# Patient Record
Sex: Male | Born: 1970
Health system: Southern US, Community
[De-identification: ages and names within clinical notes are randomized; demographics above are authoritative.]

## PROBLEM LIST (undated history)

## (undated) DIAGNOSIS — F329 Major depressive disorder, single episode, unspecified: Secondary | ICD-10-CM

## (undated) DIAGNOSIS — F419 Anxiety disorder, unspecified: Secondary | ICD-10-CM

## (undated) DIAGNOSIS — J069 Acute upper respiratory infection, unspecified: Secondary | ICD-10-CM

## (undated) DIAGNOSIS — M1990 Unspecified osteoarthritis, unspecified site: Secondary | ICD-10-CM

## (undated) DIAGNOSIS — F32A Depression, unspecified: Secondary | ICD-10-CM

## (undated) HISTORY — DX: Depression, unspecified: F32.A

## (undated) HISTORY — DX: Major depressive disorder, single episode, unspecified: F32.9

## (undated) HISTORY — PX: JOINT REPLACEMENT: SHX530

---

## 1998-04-13 ENCOUNTER — Other Ambulatory Visit: Admission: RE | Admit: 1998-04-13 | Discharge: 1998-04-13 | Payer: Self-pay | Admitting: Family Medicine

## 2004-11-30 ENCOUNTER — Ambulatory Visit (HOSPITAL_COMMUNITY): Admission: RE | Admit: 2004-11-30 | Discharge: 2004-11-30 | Payer: Self-pay | Admitting: Family Medicine

## 2008-03-14 ENCOUNTER — Encounter: Admission: RE | Admit: 2008-03-14 | Discharge: 2008-03-14 | Payer: Self-pay | Admitting: Sports Medicine

## 2008-04-18 ENCOUNTER — Emergency Department: Payer: Self-pay | Admitting: Emergency Medicine

## 2011-09-26 ENCOUNTER — Encounter (HOSPITAL_COMMUNITY): Payer: Self-pay | Admitting: Pharmacy Technician

## 2011-10-01 ENCOUNTER — Encounter (HOSPITAL_COMMUNITY): Payer: Self-pay

## 2011-10-01 ENCOUNTER — Encounter (HOSPITAL_COMMUNITY)
Admission: RE | Admit: 2011-10-01 | Discharge: 2011-10-01 | Payer: BC Managed Care – PPO | Source: Ambulatory Visit | Attending: Orthopaedic Surgery | Admitting: Orthopaedic Surgery

## 2011-10-01 ENCOUNTER — Other Ambulatory Visit (HOSPITAL_COMMUNITY): Payer: Self-pay | Admitting: Orthopaedic Surgery

## 2011-10-01 HISTORY — DX: Acute upper respiratory infection, unspecified: J06.9

## 2011-10-01 HISTORY — DX: Unspecified osteoarthritis, unspecified site: M19.90

## 2011-10-01 LAB — PROTIME-INR
INR: 1.14 (ref 0.00–1.49)
Prothrombin Time: 14.8 seconds (ref 11.6–15.2)

## 2011-10-01 LAB — BASIC METABOLIC PANEL
Calcium: 9.7 mg/dL (ref 8.4–10.5)
GFR calc non Af Amer: 77 mL/min — ABNORMAL LOW (ref >90)
Sodium: 137 mEq/L (ref 135–145)

## 2011-10-01 LAB — URINALYSIS, ROUTINE W REFLEX MICROSCOPIC
Bilirubin Urine: NEGATIVE
Ketones, ur: NEGATIVE mg/dL
Nitrite: NEGATIVE
Protein, ur: NEGATIVE mg/dL
Urobilinogen, UA: 0.2 mg/dL (ref 0.0–1.0)

## 2011-10-01 LAB — CBC
MCH: 30.4 pg (ref 26.0–34.0)
MCHC: 33.9 g/dL (ref 30.0–36.0)
Platelets: 182 10*3/uL (ref 150–400)
RBC: 4.44 MIL/uL (ref 4.22–5.81)

## 2011-10-01 NOTE — Patient Instructions (Signed)
18 Roger Travis  10/01/2011   Your procedure is scheduled on:  Fri. 10/11/2011  Report to [facility] at 0530AM.  Call this number if you have problems the morning of surgery: [phone]   Remember:   Do not eat food:After Midnight.  Do not drink clear liquids: After Midnight.  Take these medicines the morning of surgery with A SIP OF WATER: NONE   Do not wear jewelry, make-up or nail polish.  Do not wear lotions, powders, or perfumes.  Do not shave 48 hours prior to surgery.  Do not bring valuables to the hospital.  Contacts, dentures or bridgework may not be worn into surgery.  Leave suitcase in the car. After surgery it may be brought to your room.  For patients admitted to the hospital, checkout time is 11:00 AM the day of discharge.   Patients discharged the day of surgery will not be allowed to drive home.  Name and phone number of your driver:   Special Instructions: CHG Shower Use Special Wash: 1/2 bottle night before surgery and 1/2 bottle morning of surgery.   Please read over the following fact sheets that you were given: MRSA Information

## 2011-10-11 ENCOUNTER — Inpatient Hospital Stay (HOSPITAL_COMMUNITY): Payer: BC Managed Care – PPO

## 2011-10-11 ENCOUNTER — Encounter (HOSPITAL_COMMUNITY): Payer: Self-pay | Admitting: Anesthesiology

## 2011-10-11 ENCOUNTER — Inpatient Hospital Stay (HOSPITAL_COMMUNITY): Payer: BC Managed Care – PPO | Admitting: Anesthesiology

## 2011-10-11 ENCOUNTER — Inpatient Hospital Stay (HOSPITAL_COMMUNITY)
Admission: RE | Admit: 2011-10-11 | Discharge: 2011-10-13 | DRG: 818 | Disposition: A | Discharge status: Home Health | Payer: BC Managed Care – PPO | Source: Ambulatory Visit | Attending: Orthopaedic Surgery | Admitting: Orthopaedic Surgery

## 2011-10-11 ENCOUNTER — Inpatient Hospital Stay: Admit: 2011-10-11 | Payer: Self-pay | Admitting: Orthopaedic Surgery

## 2011-10-11 ENCOUNTER — Encounter (HOSPITAL_COMMUNITY)
Admission: RE | Disposition: A | Discharge status: Home Health | Payer: Self-pay | Source: Ambulatory Visit | Attending: Orthopaedic Surgery

## 2011-10-11 ENCOUNTER — Encounter (HOSPITAL_COMMUNITY): Payer: Self-pay

## 2011-10-11 DIAGNOSIS — D1739 Benign lipomatous neoplasm of skin and subcutaneous tissue of other sites: Secondary | ICD-10-CM | POA: Diagnosis present

## 2011-10-11 DIAGNOSIS — M1610 Unilateral primary osteoarthritis, unspecified hip: Principal | ICD-10-CM

## 2011-10-11 DIAGNOSIS — M169 Osteoarthritis of hip, unspecified: Principal | ICD-10-CM | POA: Diagnosis present

## 2011-10-11 HISTORY — PX: LIPOMA EXCISION: SHX5283

## 2011-10-11 HISTORY — PX: TOTAL HIP ARTHROPLASTY: SHX124

## 2011-10-11 LAB — TYPE AND SCREEN: ABO/RH(D): A POS

## 2011-10-11 SURGERY — ARTHROPLASTY, HIP, TOTAL, ANTERIOR APPROACH
Anesthesia: General | Site: Arm Lower | Laterality: Right

## 2011-10-11 SURGERY — ARTHROPLASTY, HIP, TOTAL, ANTERIOR APPROACH
Anesthesia: General | Site: Hip | Laterality: Right | Wound class: Clean

## 2011-10-11 MED ORDER — METHOCARBAMOL 500 MG PO TABS
500.0000 mg | ORAL_TABLET | Freq: Four times a day (QID) | ORAL | Status: DC | PRN
  Administered 2011-10-12 – 2011-10-13 (×2): 500 mg via ORAL
  Filled 2011-10-11 (×2): qty 1

## 2011-10-11 MED ORDER — OXYCODONE HCL 5 MG PO TABS
5.0000 mg | ORAL_TABLET | ORAL | Status: DC | PRN
  Administered 2011-10-13: 5 mg via ORAL
  Filled 2011-10-11: qty 1

## 2011-10-11 MED ORDER — METOCLOPRAMIDE HCL 5 MG/ML IJ SOLN
5.0000 mg | Freq: Three times a day (TID) | INTRAMUSCULAR | Status: DC | PRN
  Administered 2011-10-11 (×2): 10 mg via INTRAVENOUS
  Filled 2011-10-11 (×2): qty 2

## 2011-10-11 MED ORDER — ONDANSETRON HCL 4 MG/2ML IJ SOLN
4.0000 mg | Freq: Four times a day (QID) | INTRAMUSCULAR | Status: DC | PRN
  Administered 2011-10-11: 4 mg via INTRAVENOUS
  Filled 2011-10-11: qty 2

## 2011-10-11 MED ORDER — METHOCARBAMOL 100 MG/ML IJ SOLN
500.0000 mg | Freq: Four times a day (QID) | INTRAVENOUS | Status: DC | PRN
  Administered 2011-10-11: 500 mg via INTRAVENOUS
  Filled 2011-10-11 (×2): qty 5

## 2011-10-11 MED ORDER — PROMETHAZINE HCL 25 MG/ML IJ SOLN: 6.2500 mg | INTRAMUSCULAR | Status: DC | PRN

## 2011-10-11 MED ORDER — THERA M PLUS PO TABS
1.0000 | ORAL_TABLET | Freq: Every day | ORAL | Status: DC
  Administered 2011-10-12 – 2011-10-13 (×2): 1 via ORAL
  Filled 2011-10-11 (×3): qty 1

## 2011-10-11 MED ORDER — BUPIVACAINE HCL 0.25 % IJ SOLN
INTRAMUSCULAR | Status: DC | PRN
  Administered 2011-10-11: 5 mL

## 2011-10-11 MED ORDER — ZOLPIDEM TARTRATE 5 MG PO TABS: 5.0000 mg | ORAL_TABLET | Freq: Every evening | ORAL | Status: DC | PRN

## 2011-10-11 MED ORDER — DIPHENHYDRAMINE HCL 12.5 MG/5ML PO ELIX
12.5000 mg | ORAL_SOLUTION | Freq: Four times a day (QID) | ORAL | Status: DC | PRN
  Filled 2011-10-11: qty 5

## 2011-10-11 MED ORDER — LACTATED RINGERS IV SOLN
INTRAVENOUS | Status: DC | PRN
  Administered 2011-10-11 (×3): via INTRAVENOUS

## 2011-10-11 MED ORDER — PHENOL 1.4 % MT LIQD: 1.0000 | OROMUCOSAL | Status: DC | PRN

## 2011-10-11 MED ORDER — DIPHENHYDRAMINE HCL 50 MG/ML IJ SOLN: 12.5000 mg | Freq: Four times a day (QID) | INTRAMUSCULAR | Status: DC | PRN

## 2011-10-11 MED ORDER — LIDOCAINE HCL (CARDIAC) 20 MG/ML IV SOLN
INTRAVENOUS | Status: DC | PRN
  Administered 2011-10-11: 100 mg via INTRAVENOUS

## 2011-10-11 MED ORDER — ONDANSETRON HCL 4 MG PO TABS: 4.0000 mg | ORAL_TABLET | Freq: Four times a day (QID) | ORAL | Status: DC | PRN

## 2011-10-11 MED ORDER — SODIUM CHLORIDE 0.9 % IV SOLN
INTRAVENOUS | Status: DC
  Administered 2011-10-11 – 2011-10-12 (×3): via INTRAVENOUS

## 2011-10-11 MED ORDER — ACETAMINOPHEN 10 MG/ML IV SOLN
INTRAVENOUS | Status: DC | PRN
  Administered 2011-10-11: 1000 mg via INTRAVENOUS

## 2011-10-11 MED ORDER — ONDANSETRON HCL 4 MG/2ML IJ SOLN
4.0000 mg | Freq: Four times a day (QID) | INTRAMUSCULAR | Status: DC | PRN
  Administered 2011-10-11: 4 mg via INTRAVENOUS

## 2011-10-11 MED ORDER — HYDROMORPHONE HCL PF 1 MG/ML IJ SOLN
0.2500 mg | INTRAMUSCULAR | Status: DC | PRN
  Administered 2011-10-11 (×2): 0.25 mg via INTRAVENOUS

## 2011-10-11 MED ORDER — SODIUM CHLORIDE 0.9 % IJ SOLN: 9.0000 mL | INTRAMUSCULAR | Status: DC | PRN

## 2011-10-11 MED ORDER — SODIUM CHLORIDE 0.9 % IV SOLN: INTRAVENOUS | Status: DC

## 2011-10-11 MED ORDER — MORPHINE SULFATE (PF) 1 MG/ML IV SOLN
INTRAVENOUS | Status: DC
  Administered 2011-10-11: 11:00:00 via INTRAVENOUS
  Administered 2011-10-11: 1.5 mg via INTRAVENOUS
  Filled 2011-10-11: qty 25

## 2011-10-11 MED ORDER — RIVAROXABAN 10 MG PO TABS
10.0000 mg | ORAL_TABLET | ORAL | Status: DC
  Administered 2011-10-12 – 2011-10-13 (×2): 10 mg via ORAL
  Filled 2011-10-11 (×2): qty 1

## 2011-10-11 MED ORDER — POLYETHYLENE GLYCOL 3350 17 G PO PACK
17.0000 g | PACK | Freq: Every day | ORAL | Status: DC | PRN
  Filled 2011-10-11: qty 1

## 2011-10-11 MED ORDER — HYDROMORPHONE 0.3 MG/ML IV SOLN
INTRAVENOUS | Status: DC
  Administered 2011-10-11: 0.4 mg via INTRAVENOUS
  Administered 2011-10-11: 7.5 mg via INTRAVENOUS
  Administered 2011-10-12: 0.999 mg via INTRAVENOUS
  Administered 2011-10-12: 1.59 mg via INTRAVENOUS
  Administered 2011-10-12: 2 mg via INTRAVENOUS
  Administered 2011-10-12: 1.39 mg via INTRAVENOUS
  Administered 2011-10-12: 0.4 mg via INTRAVENOUS
  Administered 2011-10-13: 05:00:00 via INTRAVENOUS
  Administered 2011-10-13: 0.799 mg via INTRAVENOUS
  Administered 2011-10-13: 0.6 mg via INTRAVENOUS
  Filled 2011-10-11 (×17): qty 25

## 2011-10-11 MED ORDER — MENTHOL 3 MG MT LOZG: 1.0000 | LOZENGE | OROMUCOSAL | Status: DC | PRN

## 2011-10-11 MED ORDER — ROCURONIUM BROMIDE 100 MG/10ML IV SOLN
INTRAVENOUS | Status: DC | PRN
  Administered 2011-10-11 (×2): 10 mg via INTRAVENOUS
  Administered 2011-10-11: 50 mg via INTRAVENOUS

## 2011-10-11 MED ORDER — GLYCOPYRROLATE 0.2 MG/ML IJ SOLN
INTRAMUSCULAR | Status: DC | PRN
  Administered 2011-10-11: .6 mg via INTRAVENOUS

## 2011-10-11 MED ORDER — PROPOFOL 10 MG/ML IV EMUL
INTRAVENOUS | Status: DC | PRN
  Administered 2011-10-11: 180 mg via INTRAVENOUS

## 2011-10-11 MED ORDER — ONDANSETRON HCL 4 MG/2ML IJ SOLN
INTRAMUSCULAR | Status: DC | PRN
  Administered 2011-10-11: 4 mg via INTRAVENOUS

## 2011-10-11 MED ORDER — SAM-E 400 MG PO TABS: 1.0000 | ORAL_TABLET | Freq: Every day | ORAL | Status: DC

## 2011-10-11 MED ORDER — FERROUS SULFATE 325 (65 FE) MG PO TABS
325.0000 mg | ORAL_TABLET | Freq: Three times a day (TID) | ORAL | Status: DC
  Administered 2011-10-12 – 2011-10-13 (×5): 325 mg via ORAL
  Filled 2011-10-11 (×8): qty 1

## 2011-10-11 MED ORDER — ACETAMINOPHEN 650 MG RE SUPP: 650.0000 mg | Freq: Four times a day (QID) | RECTAL | Status: DC | PRN

## 2011-10-11 MED ORDER — CEFAZOLIN SODIUM 1-5 GM-% IV SOLN
1.0000 g | Freq: Four times a day (QID) | INTRAVENOUS | Status: AC
  Administered 2011-10-11 – 2011-10-12 (×3): 1 g via INTRAVENOUS
  Filled 2011-10-11 (×3): qty 50

## 2011-10-11 MED ORDER — DOCUSATE SODIUM 100 MG PO CAPS
100.0000 mg | ORAL_CAPSULE | Freq: Two times a day (BID) | ORAL | Status: DC
  Administered 2011-10-11 – 2011-10-13 (×4): 100 mg via ORAL
  Filled 2011-10-11 (×6): qty 1

## 2011-10-11 MED ORDER — ACETAMINOPHEN 325 MG PO TABS
650.0000 mg | ORAL_TABLET | Freq: Four times a day (QID) | ORAL | Status: DC | PRN
  Administered 2011-10-13: 650 mg via ORAL
  Administered 2011-10-13: 325 mg via ORAL
  Filled 2011-10-11 (×2): qty 2

## 2011-10-11 MED ORDER — MORPHINE SULFATE 2 MG/ML IJ SOLN: 1.0000 mg | INTRAMUSCULAR | Status: DC | PRN

## 2011-10-11 MED ORDER — NALOXONE HCL 0.4 MG/ML IJ SOLN: 0.4000 mg | INTRAMUSCULAR | Status: DC | PRN

## 2011-10-11 MED ORDER — CEFAZOLIN SODIUM 1-5 GM-% IV SOLN
1.0000 g | Freq: Once | INTRAVENOUS | Status: AC
  Administered 2011-10-11: 1 g via INTRAVENOUS

## 2011-10-11 MED ORDER — HYDROMORPHONE HCL PF 1 MG/ML IJ SOLN
INTRAMUSCULAR | Status: DC | PRN
  Administered 2011-10-11: .5 mg via INTRAVENOUS
  Administered 2011-10-11: 1 mg via INTRAVENOUS
  Administered 2011-10-11: 0.5 mg via INTRAVENOUS

## 2011-10-11 MED ORDER — FLEET ENEMA 7-19 GM/118ML RE ENEM: 1.0000 | ENEMA | Freq: Every day | RECTAL | Status: DC | PRN

## 2011-10-11 MED ORDER — DIPHENHYDRAMINE HCL 12.5 MG/5ML PO ELIX: 12.5000 mg | ORAL_SOLUTION | Freq: Four times a day (QID) | ORAL | Status: DC | PRN

## 2011-10-11 MED ORDER — MAGNESIUM HYDROXIDE 400 MG/5ML PO SUSP: 30.0000 mL | Freq: Two times a day (BID) | ORAL | Status: DC | PRN

## 2011-10-11 MED ORDER — DIPHENHYDRAMINE HCL 12.5 MG/5ML PO ELIX: 12.5000 mg | ORAL_SOLUTION | ORAL | Status: DC | PRN

## 2011-10-11 MED ORDER — ONDANSETRON HCL 4 MG/2ML IJ SOLN
4.0000 mg | Freq: Four times a day (QID) | INTRAMUSCULAR | Status: DC | PRN
  Filled 2011-10-11: qty 2

## 2011-10-11 MED ORDER — FENTANYL CITRATE 0.05 MG/ML IJ SOLN
INTRAMUSCULAR | Status: DC | PRN
  Administered 2011-10-11: 50 ug via INTRAVENOUS
  Administered 2011-10-11: 100 ug via INTRAVENOUS
  Administered 2011-10-11 (×2): 50 ug via INTRAVENOUS

## 2011-10-11 MED ORDER — NEOSTIGMINE METHYLSULFATE 1 MG/ML IJ SOLN
INTRAMUSCULAR | Status: DC | PRN
  Administered 2011-10-11: 3 mg via INTRAVENOUS

## 2011-10-11 MED ORDER — METOCLOPRAMIDE HCL 10 MG PO TABS: 5.0000 mg | ORAL_TABLET | Freq: Three times a day (TID) | ORAL | Status: DC | PRN

## 2011-10-11 MED ORDER — BISACODYL 10 MG RE SUPP
10.0000 mg | Freq: Every day | RECTAL | Status: DC | PRN
Start: 2011-10-11 — End: 2011-10-13

## 2011-10-11 MED ORDER — BISACODYL 5 MG PO TBEC: 10.0000 mg | DELAYED_RELEASE_TABLET | Freq: Every day | ORAL | Status: DC | PRN

## 2011-10-11 MED ORDER — MIDAZOLAM HCL 5 MG/5ML IJ SOLN
INTRAMUSCULAR | Status: DC | PRN
  Administered 2011-10-11: 2 mg via INTRAVENOUS

## 2011-10-11 SURGICAL SUPPLY — 65 items
BAG SPEC THK2 15X12 ZIP CLS (MISCELLANEOUS) ×4
BAG ZIPLOCK 12X15 (MISCELLANEOUS) ×6 IMPLANT
BANDAGE ELASTIC 6 VELCRO ST LF (GAUZE/BANDAGES/DRESSINGS) ×3 IMPLANT
BANDAGE ESMARK 6X9 LF (GAUZE/BANDAGES/DRESSINGS) ×2 IMPLANT
BLADE SAW SGTL 18X1.27X75 (BLADE) ×3 IMPLANT
BNDG CMPR 9X6 STRL LF SNTH (GAUZE/BANDAGES/DRESSINGS) ×2
BNDG ESMARK 6X9 LF (GAUZE/BANDAGES/DRESSINGS) ×3
CELLS DAT CNTRL 66122 CELL SVR (MISCELLANEOUS) ×2 IMPLANT
CLOTH BEACON ORANGE TIMEOUT ST (SAFETY) ×3 IMPLANT
COVER SURGICAL LIGHT HANDLE (MISCELLANEOUS) ×3 IMPLANT
CUFF TOURN SGL QUICK 34 (TOURNIQUET CUFF) ×3
CUFF TRNQT CYL 34X4X40X1 (TOURNIQUET CUFF) ×2 IMPLANT
DRAPE C-ARM 42X72 X-RAY (DRAPES) ×3 IMPLANT
DRAPE ORTHO SPLIT 77X108 STRL (DRAPES) ×3
DRAPE POUCH INSTRU U-SHP 10X18 (DRAPES) ×3 IMPLANT
DRAPE STERI IOBAN 125X83 (DRAPES) ×3 IMPLANT
DRAPE SURG ORHT 6 SPLT 77X108 (DRAPES) ×2 IMPLANT
DRAPE U-SHAPE 47X51 STRL (DRAPES) ×9 IMPLANT
DRSG ADAPTIC 3X8 NADH LF (GAUZE/BANDAGES/DRESSINGS) ×3 IMPLANT
DRSG MEPILEX BORDER 4X8 (GAUZE/BANDAGES/DRESSINGS) ×3 IMPLANT
DURAPREP 26ML APPLICATOR (WOUND CARE) ×3 IMPLANT
ELECT BLADE TIP CTD 4 INCH (ELECTRODE) ×3 IMPLANT
ELECT REM PT RETURN 9FT ADLT (ELECTROSURGICAL) ×3
ELECTRODE REM PT RTRN 9FT ADLT (ELECTROSURGICAL) ×2 IMPLANT
EVACUATOR 1/8 PVC DRAIN (DRAIN) IMPLANT
FACESHIELD LNG OPTICON STERILE (SAFETY) ×12 IMPLANT
GAUZE KERLIX 2  STERILE LF (GAUZE/BANDAGES/DRESSINGS) ×1 IMPLANT
GAUZE XEROFORM 1X8 LF (GAUZE/BANDAGES/DRESSINGS) ×3 IMPLANT
GAUZE XEROFORM 4X4 STRL (GAUZE/BANDAGES/DRESSINGS) ×3 IMPLANT
GLOVE BIO SURGEON STRL SZ7 (GLOVE) ×3 IMPLANT
GLOVE BIO SURGEON STRL SZ7.5 (GLOVE) ×3 IMPLANT
GLOVE BIOGEL PI IND STRL 7.5 (GLOVE) IMPLANT
GLOVE BIOGEL PI IND STRL 8 (GLOVE) ×2 IMPLANT
GLOVE BIOGEL PI INDICATOR 7.5 (GLOVE)
GLOVE BIOGEL PI INDICATOR 8 (GLOVE) ×1
GLOVE ORTHO TXT STRL SZ7.5 (GLOVE) ×3 IMPLANT
GOWN PREVENTION PLUS LG XLONG (DISPOSABLE) ×6 IMPLANT
GOWN STRL REIN XL XLG (GOWN DISPOSABLE) ×3 IMPLANT
KIT BASIN OR (CUSTOM PROCEDURE TRAY) ×3 IMPLANT
MANIFOLD NEPTUNE II (INSTRUMENTS) ×3 IMPLANT
NS IRRIG 1000ML POUR BTL (IV SOLUTION) ×3 IMPLANT
PACK LOWER EXTREMITY WL (CUSTOM PROCEDURE TRAY) ×3 IMPLANT
PACK TOTAL JOINT (CUSTOM PROCEDURE TRAY) ×3 IMPLANT
PADDING CAST COTTON 6X4 STRL (CAST SUPPLIES) ×3 IMPLANT
POSITIONER SURGICAL ARM (MISCELLANEOUS) ×3 IMPLANT
RETRACTOR WND ALEXIS 18 MED (MISCELLANEOUS) ×1 IMPLANT
RTRCTR WOUND ALEXIS 18CM MED (MISCELLANEOUS) ×3
SPONGE GAUZE 4X4 12PLY (GAUZE/BANDAGES/DRESSINGS) ×1 IMPLANT
STAPLER SKIN PROX WIDE 3.9 (STAPLE) IMPLANT
STOCKINETTE 8 INCH (MISCELLANEOUS) ×2 IMPLANT
SUCTION FRAZIER 12FR DISP (SUCTIONS) ×3 IMPLANT
SUT ETHIBOND NAB CT1 #1 30IN (SUTURE) ×6 IMPLANT
SUT ETHILON 3 0 PS 1 (SUTURE) ×1 IMPLANT
SUT ETHILON 4 0 PS 2 18 (SUTURE) ×6 IMPLANT
SUT MNCRL AB 4-0 PS2 18 (SUTURE) ×3 IMPLANT
SUT PROLENE 4 0 RB 1 (SUTURE) ×3
SUT PROLENE 4-0 RB1 .5 CRCL 36 (SUTURE) ×2 IMPLANT
SUT VIC AB 1 CT1 36 (SUTURE) ×6 IMPLANT
SUT VIC AB 2-0 CT1 27 (SUTURE) ×6
SUT VIC AB 2-0 CT1 TAPERPNT 27 (SUTURE) ×4 IMPLANT
SYR BULB IRRIGATION 50ML (SYRINGE) ×2 IMPLANT
TOWEL BLUE STERILE X RAY DET (MISCELLANEOUS) ×9 IMPLANT
TOWEL OR 17X26 10 PK STRL BLUE (TOWEL DISPOSABLE) ×6 IMPLANT
TRAY FOLEY CATH 14FRSI W/METER (CATHETERS) ×3 IMPLANT
WATER STERILE IRR 1500ML POUR (IV SOLUTION) ×3 IMPLANT

## 2011-10-11 NOTE — Plan of Care (Signed)
Problem: Consults Goal: Diagnosis- Total Joint Replacement Outcome: Completed/Met Date Met:  10/11/11 Right anterior hip

## 2011-10-11 NOTE — Transfer of Care (Signed)
Immediate Anesthesia Transfer of Care Note  Patient: Roger Travis  Procedure(s) Performed:  TOTAL HIP ARTHROPLASTY ANTERIOR APPROACH; EXCISION LIPOMA  Patient Location: PACU  Anesthesia Type: General  Level of Consciousness: awake and alert   Airway & Oxygen Therapy: Patient Spontanous Breathing and Patient connected to face mask oxygen  Post-op Assessment: Report given to PACU RN and Post -op Vital signs reviewed and stable  Post vital signs: Reviewed and stable  Complications: No apparent anesthesia complications

## 2011-10-11 NOTE — Op Note (Signed)
NAMEROWDY, GUERRINI        ACCOUNT NO.:  [id number]  MEDICAL RECORD NO.:  [id number]  LOCATION:  1616                         FACILITY:  [facility]  PHYSICIAN:  Timothey Dahlstrom. Magnus Ivan, M.D.DATE OF BIRTH:  [birth year]  DATE OF PROCEDURE:  10/11/2011 DATE OF DISCHARGE:                              OPERATIVE REPORT   PREOPERATIVE DIAGNOSES: 1. Severe osteoarthritis and degenerative joint disease, right hip. 2. Right elbow superficial mass consistent with benign lipoma.  POSTOPERATIVE DIAGNOSES: 1. Severe osteoarthritis and degenerative joint disease, right hip. 2. Benign lipoma, right elbow.  PROCEDURES: 1. Right total hip arthroplasty through direct anterior approach. 2. Excision of superficial lipoma, right elbow.  IMPLANTS:  DePuy Pinnacle Sector acetabular component with 1 screw size 50, neutral +4 polyethylene liner, size 9 standard offset [place]ail femoral component with HA coating and a collar, size 32+ 1 ceramic hip ball.  SURGEON:  Kirill Chatterjee. Magnus Ivan, M.D.  ANESTHESIA:  General.  ANTIBIOTICS:  1 g IV Ancef.  BLOOD LOSS:  700 cc.  COMPLICATIONS:  None.  DISPOSITION:  To PACU in stable condition.  INDICATIONS:  Mr. Fahl is a very pleasant 40 year old gentleman with severe end-stage arthritis involving his right hip.  He has been a daily runner for a long period of time.  He has had documented x-ray evidence of bone on bone wear of his hip.  An x-ray several years ago in 2008 had a right hip arthroscopy.  Between 2008 and 2009, they found significant changes of with a torn labrum and it was recommended at that time that he would eventually need a hip replacement.  Now, he has failed the arthroscopic intervention as well as injections in the hip with steroid. It is at that point where he wishes to proceed with a total hip arthroplasty given the detriment to his quality of life, and his pain. The risks and benefits of surgery were explained to him in  detail, including the risk of acute blood loss anemia, DVT, and PE.  He also wants [place] to excise the lipoma on his right elbow, it has been there for over 10 years and has not grown in size, but it bothers him when he keeps his elbow down on things. On exam this looks like a benign lipoma.  PROCEDURE:  After informed consent was obtained, the appropriate right hip and right elbow were marked.  He was brought to the operating room and general anesthesia was obtained when he was on the stretcher.  A Foley catheter was then placed and traction boots were applied.  He was placed on the Hana fracture table with both arms out on arm boards.  A perineal post was placed and both feet were placed in the traction devices with no traction applied.  His right hip was then assessed under fluoroscopic guidance.  We could then obtain the hip center as well as the center of the pelvis, consistent with x-rays.  We then prepped the right hip with DuraPrep and sterile drapes including a sterile shower curtain drape.  Time-out was called to identify the correct patient, correct right hip and right elbow.  The surgeon made a decision to get the hip fixed first before proceeding with the elbow.  I then  made an incision 1 cm distal and 3 cm posterior to the anterior superior iliac spine and carried this obliquely down the leg.  We dissected easily to the tensor fascia lata.  It was felt that he had a  huge and strong tensor fascia lata muscle as well as vastus lateralis and quadriceps. We divided the tensor fascia lata and proceeded with a direct anterior approach to the hip.  Retractors were placed under the lateral neck and medial, we cauterized the lateral femoral circumflex vessels.  We did get a significant amount of bleeding from these vessels when we were trying to cauterize them.  I then divided the hip capsule and made a femoral neck cut proximal to the lesser trochanter, removed the head in its  entirety and found it to have complete loss of cartilage, especially around the superior and lateral aspects with the track of several centimeters of cartilage loss down the bone.  Medial retractor and lateral retractors were then placed and we began reaming from size 42 up to 48 reamer, the 48 and 49 reamers were placed under direct fluoroscopic guidance as well as visualization, so we could obtain inversion, depth and inclinations.  We then placed the real size 50 acetabular component from DePuy, which is a Pinnacle component with Gription.  We placed this and placed 1 screw.  We then placed a neutral +4 polyethylene liner and attention was turned to the femur.  With the leg externally rotated to 90 degrees, a hook placed temporarily underneath the vastus ridge, the leg was extended and abducted to allow access to the femoral canal.  We used a box cutting guide as well as a rongeur and then began broaching from size 8 just to a size 9 broach. He had very strong bone and very thick cortices at 40 years old, so after the size 9 trial we trialed a +1, 32 head and reduced this in the acetabulum.  His leg lengths showed him to be maybe just a touch short, but with other size he is too long, so I felt stability wise this would be the size to go with.  We then placed a real size 9 femoral component with HA coating and a collar and then the real +1 ceramic head, reduced this into the acetabulum and with our x-rays it looked like he was just even a touch long.  We could not go down a hip size and I could not go up a stem size as well.  He had stable external rotation past 90 degrees, and I could not dislocate it with internal rotation to 45 degrees.  His flexion, extension was good and he had minimal shuck.  We then copiously irrigated the tissues and closed the joint capsule with interrupted #1 Ethibond suture followed by running #1 in the tensor fascia lata.  The subcutaneous tissue was closed  with interrupted 2-0 nylon and interrupted staples on the skin.  A well-padded sterile dressing was applied.  We then broke scrub and re-prepped and draped the right elbow with DuraPrep and sterile drapes and a sterile stockinette. With new gown and gloves, I made a small incision over the benign appearing mass.  We dissected around this and it was consistent with a lipoma.  We removed this in its entirety and it was a little bigger than the size of a quarter.  We then copiously irrigated the tissue here and closed this with an interrupted 3-0 nylon suture.  A well-padded sterile dressing was  applied.  The patient was then awakened, extubated, taken off the fracture table, and to the recovery room in stable condition.     Vanita Panda. Magnus Ivan, M.D.     CYB/MEDQ  D:  10/11/2011  T:  10/11/2011  Job:  [id number]

## 2011-10-11 NOTE — Anesthesia Preprocedure Evaluation (Signed)
Anesthesia Evaluation  Patient identified by MRN, date of birth, ID band Patient awake    Reviewed: Allergy & Precautions, H&P , NPO status , Patient's Chart, lab work & pertinent test results, reviewed documented beta blocker date and time   Airway Mallampati: II  Neck ROM: Full    Dental  (+) Dental Advisory Given   Pulmonary neg pulmonary ROS,  clear to auscultation        Cardiovascular neg cardio ROS Regular Normal Denies cardiac symptoms   Neuro/Psych Negative Neurological ROS  Negative Psych ROS   GI/Hepatic negative GI ROS, Neg liver ROS,   Endo/Other  Negative Endocrine ROS  Renal/GU negative Renal ROS  Genitourinary negative   Musculoskeletal negative musculoskeletal ROS (+)   Abdominal   Peds negative pediatric ROS (+)  Hematology negative hematology ROS (+)   Anesthesia Other Findings Upper front bridge   Reproductive/Obstetrics negative OB ROS                           Anesthesia Physical Anesthesia Plan  ASA: I  Anesthesia Plan: General   Post-op Pain Management:    Induction: Intravenous  Airway Management Planned: Oral ETT  Additional Equipment:   Intra-op Plan:   Post-operative Plan: Extubation in OR  Informed Consent: I have reviewed the patients History and Physical, chart, labs and discussed the procedure including the risks, benefits and alternatives for the proposed anesthesia with the patient or authorized representative who has indicated his/her understanding and acceptance.     Plan Discussed with: CRNA and Surgeon  Anesthesia Plan Comments:         Anesthesia Quick Evaluation

## 2011-10-11 NOTE — Brief Op Note (Signed)
10/11/2011  10:14 AM  PATIENT:  Roger Travis  40 y.o. male  PRE-OPERATIVE DIAGNOSIS:  Severe Osteoarthritis of the Right Hip, Benign Appearing Mass Right Elbow  POST-OPERATIVE DIAGNOSIS:  Severe Osteoarthritis of the Right Hip, Benign Appearing Mass Right Elbow  PROCEDURE:  Procedure(s): TOTAL HIP ARTHROPLASTY ANTERIOR APPROACH EXCISION LIPOMA  SURGEON:  Surgeon(s): Kathryne Hitch  PHYSICIAN ASSISTANT:   ASSISTANTS: none   ANESTHESIA:   general  EBL:  Total I/O In: 2000 [I.V.:2000] Out: 900 [Urine:200; Blood:700]  BLOOD ADMINISTERED:none  DRAINS: none   LOCAL MEDICATIONS USED:  MARCAINE 5CC  SPECIMEN:  No Specimen  DISPOSITION OF SPECIMEN:  N/A  COUNTS:  YES  TOURNIQUET:  * Missing tourniquet times found for documented tourniquets in log:  7588 *  DICTATION: .Other Dictation: Dictation Number [id number]  PLAN OF CARE: Admit to inpatient   PATIENT DISPOSITION:  PACU - hemodynamically stable.   Delay start of Pharmacological VTE agent (>24hrs) due to surgical blood loss or risk of bleeding:  {YES/NO/NOT APPLICABLE:20182

## 2011-10-11 NOTE — Anesthesia Postprocedure Evaluation (Signed)
  Anesthesia Post-op Note  Patient: Arboriculturist  Procedure(s) Performed:  TOTAL HIP ARTHROPLASTY ANTERIOR APPROACH; EXCISION LIPOMA  Patient Location: PACU  Anesthesia Type: General  Level of Consciousness: awake and oriented  Airway and Oxygen Therapy: Patient Spontanous Breathing and Patient connected to nasal cannula oxygen  Post-op Pain: mild  Post-op Assessment: Post-op Vital signs reviewed, Patient's Cardiovascular Status Stable, Respiratory Function Stable and Patent Airway  Post-op Vital Signs: stable  Complications: No apparent anesthesia complications

## 2011-10-11 NOTE — H&P (Signed)
Roger Travis is an 40 y.o. male.   Chief Complaint:1) Right hip severe deg. joint disease, osteoarthritis, 2) Right elbow mass. HPI:40 yo male with end-stage OA of right hip.  Previous hip scope showing extent of disease.  Now presents for right total hip replacement.  Also with mass on right elbow consistant with benign lipoma.  Past Medical History  Diagnosis Date  . Arthritis     right hip  . Recurrent upper respiratory infection (URI) 2006 or2007    walking pneumonia    History reviewed. No pertinent past surgical history.  History reviewed. No pertinent family history. Social History:  reports that he has never smoked. He does not have any smokeless tobacco history on file. He reports that he drinks about .6 ounces of alcohol per week. He reports that he does not use illicit drugs.  Allergies:  Allergies  Allergen Reactions  . Biaxin (Clarithromycin Er) Nausea And Vomiting  . Corn-Containing Products Other (See Comments)    headache  . Vicodin (Hydrocodone-Acetaminophen) Nausea And Vomiting  . Codeine Nausea And Vomiting    Medications Prior to Admission  Medication Dose Route Frequency Provider Last Rate Last Dose  . ceFAZolin (ANCEF) IVPB 1 g/50 mL premix  1 g Intravenous Once Kathryne Hitch       No current outpatient prescriptions on file as of 10/11/2011.    Results for orders placed during the hospital encounter of 10/11/11 (from the past 48 hour(s))  TYPE AND SCREEN     Status: Normal (Preliminary result)   Collection Time   10/11/11  5:51 AM      Component Value Range Comment   ABO/RH(D) A POS      Antibody Screen PENDING      Sample Expiration 10/14/2011      No results found.  Review of Systems  Constitutional: Negative.   HENT: Negative.   Eyes: Negative.   Respiratory: Negative.   Cardiovascular: Negative.   Gastrointestinal: Negative.   Genitourinary: Negative.   Musculoskeletal: Negative.   Skin: Negative.   Neurological:  Negative.   Psychiatric/Behavioral: Negative.     Blood pressure 122/68, pulse 65, temperature 97.5 F (36.4 C), temperature source Oral, resp. rate 16, SpO2 98.00%. Physical Exam  Constitutional: He is oriented to person, place, and time. He appears well-developed and well-nourished.  HENT:  Head: Normocephalic and atraumatic.  Eyes: Conjunctivae and EOM are normal. Pupils are equal, round, and reactive to light.  Neck: Normal range of motion. Neck supple.  Cardiovascular: Normal rate, regular rhythm, normal heart sounds and intact distal pulses.   GI: Soft. Bowel sounds are normal.  Musculoskeletal: Normal range of motion.  Neurological: He is alert and oriented to person, place, and time.  Skin: Skin is warm.  Psychiatric: He has a normal mood and affect.     Assessment/Plan Will proceed to OR today for a right hip replacement.  Understands risks and benefits.  Will also remove benign mass from right elbow.  [name],Verne Y 10/11/2011, 7:16 AM

## 2011-10-12 LAB — CBC
HCT: 31.3 % — ABNORMAL LOW (ref 39.0–52.0)
Hemoglobin: 10.8 g/dL — ABNORMAL LOW (ref 13.0–17.0)
MCH: 30.8 pg (ref 26.0–34.0)
RBC: 3.51 MIL/uL — ABNORMAL LOW (ref 4.22–5.81)

## 2011-10-12 LAB — BASIC METABOLIC PANEL
BUN: 9 mg/dL (ref 6–23)
CO2: 31 mEq/L (ref 19–32)
Calcium: 8.2 mg/dL — ABNORMAL LOW (ref 8.4–10.5)
Glucose, Bld: 121 mg/dL — ABNORMAL HIGH (ref 70–99)
Sodium: 136 mEq/L (ref 135–145)

## 2011-10-12 NOTE — Progress Notes (Signed)
Physical Therapy Treatment Patient Details Name: Roger Travis MRN: [id number] DOB: 14-Mar-1971 Today's Date: 10/12/2011 1129 -1148  GT PT Assessment/Plan  PT - Assessment/Plan PT Plan: Discharge plan remains appropriate PT Frequency: 7X/week Follow Up Recommendations: None Equipment Recommended: None recommended by PT PT Goals  Acute Rehab PT Goals PT Goal: Sit to Supine/Side - Progress: Progressing toward goal PT Transfer Goal: Sit to Stand/Stand to Sit - Progress: Progressing toward goal PT Goal: Ambulate - Progress: Progressing toward goal PT Goal: Up/Down Stairs - Progress: Not met  PT Treatment Precautions/Restrictions  Restrictions Weight Bearing Restrictions: Yes RLE Weight Bearing: Weight bearing as tolerated Mobility (including Balance) Bed Mobility Sit to Supine - Right: 5: Supervision Sit to Supine - Right Details (indicate cue type and reason): min VC for technique Transfers Sit to Stand: 5: Supervision;4: Min assist;With upper extremity assist;With armrests;From chair/3-in-1 Stand to Sit: 5: Supervision;To bed;With upper extremity assist Ambulation/Gait Ambulation/Gait Assistance: 5: Supervision;4: Min assist Ambulation Distance (Feet): 200 Feet Assistive device: Lofstrands Gait Pattern: Step-through pattern    Exercise    End of Session PT - End of Session Activity Tolerance: Patient tolerated treatment well Patient left: in bed Nurse Communication: Mobility status for transfers;Mobility status for ambulation;Other (comment) (RN agreeable to pt ambulating with spouse after IV d/c) General Behavior During Session: [facility] for tasks performed Cognition: [facility] for tasks performed  Lylian Sanagustin 10/12/2011, 1:21 PM

## 2011-10-12 NOTE — Progress Notes (Signed)
Subjective: 1 Day Post-Op Procedure(s) (LRB): TOTAL HIP ARTHROPLASTY ANTERIOR APPROACH (Right) EXCISION LIPOMA (Right) Patient reports pain as moderate.  Did well with PT this am.  No acute changes.  Objective: Vital signs in last 24 hours: Temp:  [98.4 F (36.9 C)-99.2 F (37.3 C)] 98.4 F (36.9 C) (11/24 0600) Pulse Rate:  [55-77] 73  (11/24 0600) Resp:  [12-20] 20  (11/24 1308) BP: (114-123)/(64-77) 123/77 mmHg (11/24 0600) SpO2:  [98 %-100 %] 98 % (11/24 1308)  Intake/Output from previous day: 11/23 0701 - 11/24 0700 In: 4949.3 [P.O.:220; I.V.:4579.3; IV Piggyback:150] Out: 3350 [Urine:2550; Emesis/NG output:100; Blood:700] Intake/Output this shift: Total I/O In: 240 [P.O.:240] Out: 900 [Urine:900]   Basename 10/12/11 0510  HGB 10.8*    Basename 10/12/11 0510  WBC 8.7  RBC 3.51*  HCT 31.3*  PLT 146*    Basename 10/12/11 0510  NA 136  K 3.5  CL 99  CO2 31  BUN 9  CREATININE 0.99  GLUCOSE 121*  CALCIUM 8.2*   No results found for this basename: LABPT:2,INR:2 in the last 72 hours  Sensation intact distally Intact pulses distally Incision: scant drainage Compartment soft  Assessment/Plan: 1 Day Post-Op Procedure(s) (LRB): TOTAL HIP ARTHROPLASTY ANTERIOR APPROACH (Right) EXCISION LIPOMA (Right) Discharge home with home health in the next 1-2 days  Liberty Seto,Duffy Y 10/12/2011, 1:42 PM

## 2011-10-12 NOTE — Progress Notes (Signed)
Physical Therapy Evaluation Patient Details Name: Roger Travis MRN: [id number] DOB: Jan 29, 1971 Today's Date: 10/12/2011 0838 - 0908 EVAL Problem List:  Patient Active Problem List  Diagnoses  . Hip arthritis    Past Medical History:  Past Medical History  Diagnosis Date  . Arthritis     right hip  . Recurrent upper respiratory infection (URI) 2006 or2007    walking pneumonia   Past Surgical History: History reviewed. No pertinent past surgical history.  PT Assessment/Plan/Recommendation PT Assessment Clinical Impression Statement: PT with ant direct hip presents with decreased functional mobility and will benefit from skilled PT intervention to maximize IND for d/c home PT Recommendation/Assessment: Patient will need skilled PT in the acute care venue PT Problem List: Decreased strength;Decreased range of motion;Decreased activity tolerance;Decreased mobility;Decreased knowledge of use of DME PT Therapy Diagnosis : Difficulty walking PT Plan PT Frequency: 7X/week PT Treatment/Interventions: DME instruction;Gait training;Stair training;Functional mobility training;Therapeutic exercise;Patient/family education PT Recommendation Follow Up Recommendations: None (Pt's wife is PT) Equipment Recommended: None recommended by PT PT Goals  Acute Rehab PT Goals PT Goal Formulation: With patient Time For Goal Achievement: 5 days Pt will go Supine/Side to Sit: with modified independence PT Goal: Supine/Side to Sit - Progress: Not met Pt will go Sit to Supine/Side: with modified independence PT Goal: Sit to Supine/Side - Progress: Not met Pt will Transfer Sit to Stand/Stand to Sit: with modified independence PT Transfer Goal: Sit to Stand/Stand to Sit - Progress: Not met Pt will Ambulate: >150 feet;with supervision;with crutches PT Goal: Ambulate - Progress: Not met Pt will Go Up / Down Stairs: 1-2 stairs;with min assist;with crutches PT Goal: Up/Down Stairs - Progress: Not  met  PT Evaluation Precautions/Restrictions  Restrictions RLE Weight Bearing: Weight bearing as tolerated Prior Functioning  Home Living Lives With: Family Receives Help From: Family Type of Home: House Home Layout: One level Home Access: Stairs to enter Entrance Stairs-Rails: None Entrance Stairs-Number of Steps: 1 Prior Function Level of Independence: Independent with basic ADLs;Independent with gait;Independent with transfers Able to Take Stairs?: Yes Cognition Cognition Arousal/Alertness: Awake/alert Overall Cognitive Status: Appears within functional limits for tasks assessed Orientation Level: Oriented X4 Sensation/Coordination Coordination Gross Motor Movements are Fluid and Coordinated: Yes Extremity Assessment RUE Assessment RUE Assessment: Within Functional Limits LUE Assessment LUE Assessment: Within Functional Limits RLE Assessment RLE Assessment: Within Functional Limits LLE Assessment LLE Assessment: Within Functional Limits Mobility (including Balance) Bed Mobility Bed Mobility: Yes Supine to Sit: 5: Supervision Transfers Transfers: Yes Sit to Stand: 5: Supervision;4: Min assist;With upper extremity assist;From bed Sit to Stand Details (indicate cue type and reason): cues for UE use Stand to Sit: 4: Min assist;5: Supervision;To chair/3-in-1;With armrests;With upper extremity assist Stand to Sit Details: cues for UE use Ambulation/Gait Ambulation/Gait: Yes Ambulation/Gait Assistance: 6: Modified independent (Device/Increase time);5: Supervision Ambulation/Gait Assistance Details (indicate cue type and reason): min guard assist with cues for posture, sequence and position from RW Ambulation Distance (Feet): 115 Feet Assistive device: Rolling walker Gait Pattern: Step-to pattern    Exercise  Total Joint Exercises Ankle Circles/Pumps: AROM;15 reps;Both;Supine Quad Sets: AROM;Both;10 reps;Supine Heel Slides: AAROM;10 reps;Supine;Right Hip  ABduction/ADduction: AAROM;10 reps;Supine;Right End of Session PT - End of Session Activity Tolerance: Patient tolerated treatment well Patient left: in chair;with call bell in reach;with family/visitor present Nurse Communication: Mobility status for transfers;Mobility status for ambulation General Behavior During Session: [facility] for tasks performed Cognition: [facility] for tasks performed  Asaiah Hunnicutt 10/12/2011, 11:20 AM

## 2011-10-13 LAB — CBC
Hemoglobin: 10.5 g/dL — ABNORMAL LOW (ref 13.0–17.0)
MCH: 30.6 pg (ref 26.0–34.0)
MCHC: 34.1 g/dL (ref 30.0–36.0)
MCV: 89.8 fL (ref 78.0–100.0)
RBC: 3.43 MIL/uL — ABNORMAL LOW (ref 4.22–5.81)

## 2011-10-13 MED ORDER — RIVAROXABAN 10 MG PO TABS: 10.0000 mg | ORAL_TABLET | ORAL | Status: DC

## 2011-10-13 MED ORDER — METHOCARBAMOL 500 MG PO TABS: 500.0000 mg | ORAL_TABLET | Freq: Four times a day (QID) | ORAL | Status: AC | PRN

## 2011-10-13 MED ORDER — OXYCODONE-ACETAMINOPHEN 5-325 MG PO TABS: 1.0000 | ORAL_TABLET | ORAL | Status: AC | PRN

## 2011-10-13 MED ORDER — ONDANSETRON HCL 4 MG PO TABS: 4.0000 mg | ORAL_TABLET | Freq: Four times a day (QID) | ORAL | Status: AC | PRN

## 2011-10-13 NOTE — Progress Notes (Signed)
physical Therapy Treatment Patient Details Name: Roger Travis MRN: [id number] DOB: 1970-12-15 Today's Date: 10/13/2011 915-935 g PT Assessment/Plan  PT - Assessment/Plan PT Plan: Discharge plan remains appropriate PT Frequency: 7X/week Follow Up Recommendations: None Equipment Recommended: None recommended by PT PT Goals  Acute Rehab PT Goals PT Goal Formulation: With patient Time For Goal Achievement: 5 days Pt will go Supine/Side to Sit: with modified independence PT Goal: Supine/Side to Sit - Progress: Met Pt will go Sit to Supine/Side: with modified independence PT Goal: Sit to Supine/Side - Progress: Met Pt will Transfer Sit to Stand/Stand to Sit: with modified independence PT Transfer Goal: Sit to Stand/Stand to Sit - Progress: Partly met Pt will Ambulate: >150 feet;with supervision;with crutches PT Goal: Ambulate - Progress: Partly met Pt will Go Up / Down Stairs: 1-2 stairs;with min assist;with crutches PT Goal: Up/Down Stairs - Progress: Met  PT Treatment Precautions/Restrictions  Restrictions Weight Bearing Restrictions: No RLE Weight Bearing: Weight bearing as tolerated Mobility (including Balance) Bed Mobility Bed Mobility: Yes Supine to Sit: 5: Supervision Sit to Supine - Right: 5: Supervision Transfers Transfers: Yes Sit to Stand: 5: Supervision Stand to Sit: 5: Supervision Ambulation/Gait Ambulation/Gait: Yes Ambulation/Gait Assistance: 5: Supervision Ambulation Distance (Feet): 400 Feet Assistive device: Lofstrands Gait Pattern: Step-through pattern  Stairs: up and down 1 step with crutches and supervision   Exercise  Total Joint Exercises Ankle Circles/Pumps: AROM;Left;15 reps;Supine Quad Sets: Right;10 reps;Supine Short Arc Quad: AROM;Right;10 reps;Supine Heel Slides: AAROM;10 reps;Supine (pt used sheet ) Hip ABduction/ADduction: AROM;10 reps;Right;Supine Long Arc Quad:  (vrbally reviewed) End of Session PT - End of Session Activity  Tolerance: Patient tolerated treatment well Patient left: in bed Nurse Communication:  (pt ready for dc after MD comes) General Behavior During Session: [facility] for tasks performed Cognition: [facility] for tasks performed  Rada Hay 10/13/2011, 10:22 AM

## 2011-10-13 NOTE — Discharge Summary (Signed)
Physician Discharge Summary  Patient ID: Roger Travis MRN: [id number] DOB/AGE: 01-02-1971 40 y.o.  Admit date: 10/11/2011 Discharge date: 10/13/2011  Admission Diagnoses:  <principal problem not specified>  Discharge Diagnoses:  Active Problems:  Hip arthritis   Past Medical History  Diagnosis Date  . Arthritis     right hip  . Recurrent upper respiratory infection (URI) 2006 or2007    walking pneumonia    Surgeries: Procedure(s): TOTAL HIP ARTHROPLASTY ANTERIOR APPROACH EXCISION LIPOMA on 10/11/2011   Consultants (if any):    Discharged Condition: Improved  Hospital Course: Roger Travis is an 40 y.o. male who was admitted 10/11/2011 with a diagnosis of severe DJD/OA right hip and went to the operating room on 10/11/2011 and underwent the above named procedures.    He was given perioperative antibiotics:  Anti-infectives     Start     Dose/Rate Route Frequency Ordered Stop   10/11/11 1200   ceFAZolin (ANCEF) IVPB 1 g/50 mL premix        1 g 100 mL/hr over 30 Minutes Intravenous Every 6 hours 10/11/11 1143 10/12/11 0040   10/11/11 0545   ceFAZolin (ANCEF) IVPB 1 g/50 mL premix        1 g 100 mL/hr over 30 Minutes Intravenous  Once 10/11/11 0542 10/11/11 0730        .  He was given sequential compression devices, early ambulation, and chemoprophylaxis for DVT prophylaxis.  They benefited maximally from their hospital stay and there were no complications.    Recent vital signs:  Filed Vitals:   10/13/11 0916  BP:   Pulse:   Temp:   Resp: 20    Recent laboratory studies:  Lab Results  Component Value Date   HGB 10.5* 10/13/2011   HGB 10.8* 10/12/2011   HGB 13.5 10/01/2011   Lab Results  Component Value Date   WBC 8.2 10/13/2011   PLT 144* 10/13/2011   Lab Results  Component Value Date   INR 1.14 10/01/2011   Lab Results  Component Value Date   NA 136 10/12/2011   K 3.5 10/12/2011   CL 99 10/12/2011   CO2 31 10/12/2011   BUN 9 10/12/2011   CREATININE 0.99 10/12/2011   GLUCOSE 121* 10/12/2011    Discharge Medications:  See below Current Discharge Medication List    START taking these medications   Details  methocarbamol (ROBAXIN) 500 MG tablet Take 1 tablet (500 mg total) by mouth every 6 (six) hours as needed. Qty: 30 tablet, Refills: 0    ondansetron (ZOFRAN) 4 MG tablet Take 1 tablet (4 mg total) by mouth every 6 (six) hours as needed for nausea. Qty: 20 tablet, Refills: 0    oxyCODONE-acetaminophen (ROXICET) 5-325 MG per tablet Take 1-2 tablets by mouth every 4 (four) hours as needed for pain. Qty: 30 tablet, Refills: 0    rivaroxaban (XARELTO) 10 MG TABS tablet Take 1 tablet (10 mg total) by mouth daily. Qty: 20 tablet, Refills: 0      CONTINUE these medications which have NOT CHANGED   Details  fish oil-omega-3 fatty acids 1000 MG capsule Take 1 g by mouth 2 (two) times daily.     Multiple Vitamins-Minerals (MULTIVITAMINS THER. W/MINERALS) TABS Take 1 tablet by mouth daily.     OVER THE COUNTER MEDICATION Take 1 tablet by mouth daily. 5-Hydroxytryptophan (5-HTP),    S-Adenosylmethionine (SAM-E) 400 MG TABS Take 1 tablet by mouth daily.     zolpidem (AMBIEN) 5 MG tablet Take 5  mg by mouth at bedtime as needed. Sleep       STOP taking these medications     butalbital-acetaminophen-caffeine (FIORICET, ESGIC) 50-325-40 MG per tablet      Misc Natural Products ([facility] GLUCOS-CHONDROIT-MSM TS PO)         Diagnostic Studies: Ap Pelvis Hip  10/11/2011  *RADIOLOGY REPORT*  Clinical Data: Postop  PELVIS - 1-2 VIEW  Comparison: 03/14/2008  Findings: Right total hip arthroplasty has been placed.  Anatomic alignment of the osseous and prosthetic structures.  No breakage or loosening of the hardware.  IMPRESSION: Right total hip arthroplasty anatomically aligned.  Original Report Authenticated By: Donavan Burnet, M.D.   Dg Hip 1 View Right  10/11/2011  *RADIOLOGY REPORT*  Clinical Data: Postop   RIGHT HIP - 1 VIEW  Comparison: 03/14/2008  Findings: Right total hip arthroplasty is anatomically aligned.  No breakage or loosening of the hardware.  IMPRESSION: Right total hip arthroplasty anatomically aligned.  Original Report Authenticated By: Donavan Burnet, M.D.   Dg Hip Complete Right  10/11/2011  *RADIOLOGY REPORT*  Clinical Data: Right hip arthroplasty.  RIGHT HIP - COMPLETE 2+ VIEW  Comparison: 03/14/2008.  Findings: Two intraoperative fluoroscopic spot views demonstrate a right total hip arthroplasty with acetabular cup anchor screw.  No complicating features are identified on these fluoroscopic films.  IMPRESSION: Right total hip arthroplasty.  Original Report Authenticated By: Andreas Newport, M.D.   Dg C-arm 61-120 Min-no Report  10/11/2011  CLINICAL DATA: right hip replacement anterior   C-ARM 61-120 MINUTES  Fluoroscopy was utilized by the requesting physician.  No radiographic  interpretation.      Disposition: D/C to home  Discharge Orders    Future Orders Please Complete By Expires   Diet - low sodium heart healthy      Call MD / Call 911      Comments:   If you experience chest pain or shortness of breath, CALL 911 and be transported to the hospital emergency room.  If you develope a fever above 101 F, pus (white drainage) or increased drainage or redness at the wound, or calf pain, call your surgeon's office.   Constipation Prevention      Comments:   Drink plenty of fluids.  Prune juice may be helpful.  You may use a stool softener, such as Colace (over the counter) 100 mg twice a day.  Use MiraLax (over the counter) for constipation as needed.   Increase activity slowly as tolerated      Weight Bearing as taught in Physical Therapy      Comments:   Use a walker or crutches as instructed.   Driving restrictions      Comments:   No driving for until off of narcotics.   Follow the hip precautions as taught in Physical Therapy      Change dressing      Comments:    You may change your dressing on 11/28. You can get your incision wet starting 11/28. Dry dressing or band-aids daily as needed.         SignedYUJI, WALTH 10/13/2011, 10:37 AM

## 2011-10-13 NOTE — Plan of Care (Signed)
Problem: Consults Goal: Total Joint Replacement Patient Education See Patient Education Module for education specifics.  Outcome: Completed/Met Date Met:  10/13/11 Pre-op

## 2011-10-17 ENCOUNTER — Encounter (HOSPITAL_COMMUNITY): Payer: Self-pay | Admitting: Orthopaedic Surgery

## 2012-04-12 ENCOUNTER — Ambulatory Visit (INDEPENDENT_AMBULATORY_CARE_PROVIDER_SITE_OTHER): Payer: BC Managed Care – PPO | Admitting: Family Medicine

## 2012-04-12 VITALS — BP 113/74 | HR 51 | Temp 98.0°F | Resp 16 | Ht 65.5 in | Wt 154.2 lb

## 2012-04-12 DIAGNOSIS — R1013 Epigastric pain: Secondary | ICD-10-CM

## 2012-04-12 LAB — POCT CBC
Granulocyte percent: 71 %G (ref 37–80)
HCT, POC: 41.3 % — AB (ref 43.5–53.7)
Hemoglobin: 13.6 g/dL — AB (ref 14.1–18.1)
Lymph, poc: 1.8 (ref 0.6–3.4)
MCH, POC: 29.8 pg (ref 27–31.2)
MCHC: 32.9 g/dL (ref 31.8–35.4)
MCV: 90.4 fL (ref 80–97)
MID (cbc): 0.6 (ref 0–0.9)
MPV: 8.5 fL (ref 0–99.8)
POC Granulocyte: 5.8 (ref 2–6.9)
POC LYMPH PERCENT: 22.2 %L (ref 10–50)
POC MID %: 6.8 %M (ref 0–12)
Platelet Count, POC: 229 10*3/uL (ref 142–424)
RBC: 4.57 M/uL — AB (ref 4.69–6.13)
RDW, POC: 14.3 %
WBC: 8.2 10*3/uL (ref 4.6–10.2)

## 2012-04-12 MED ORDER — GI COCKTAIL ~~LOC~~: 30.0000 mL | Freq: Once | ORAL | Status: DC

## 2012-04-12 MED ORDER — SUCRALFATE 1 GM/10ML PO SUSP: 1.0000 g | Freq: Four times a day (QID) | ORAL | Status: DC

## 2012-04-12 NOTE — Progress Notes (Addendum)
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 He comes in with 4 hours of epigastric pain which she's never had before. He's also had some associated vomiting. He's had no change in his diet or medications. Patient says that the pain decreases with self applied epigastric pressure.  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 He had a hip replacement done about 4 months ago. He is unwell since.  Objective: Mild distress, holding epigastrium.  Alert and cooperative  Chest: Clear  Heart: Regular, no murmur  HEENT: Unremarkable  Neck supple  Abdomen: Active bowel sounds, no HSM, no masses, mildly tender in epigastrium.  At 5:40 PM patient was given GI cocktail.--> By 6:00 PM no relief Results for orders placed during the hospital encounter of 10/11/11  TYPE AND SCREEN      Component Value Range   ABO/RH(D) A POS     Antibody Screen NEG     Sample Expiration 10/14/2011    ABO/RH      Component Value Range   ABO/RH(D) A POS    CBC      Component Value Range   WBC 8.7  4.0 - 10.5 (K/uL)   RBC 3.51 (*) 4.22 - 5.81 (MIL/uL)   Hemoglobin 10.8 (*) 13.0 - 17.0 (g/dL)   HCT 16.1 (*) 09.6 - 52.0 (%)   MCV 89.2  78.0 - 100.0 (fL)   MCH 30.8  26.0 - 34.0 (pg)   MCHC 34.5  30.0 - 36.0 (g/dL)   RDW 04.5  40.9 - 81.1 (%)   Platelets 146 (*) 150 - 400 (K/uL)  BASIC METABOLIC PANEL      Component Value Range   Sodium 136  135 - 145 (mEq/L)   Potassium 3.5  3.5 - 5.1 (mEq/L)   Chloride 99  96 - 112 (mEq/L)   CO2 31  19 - 32 (mEq/L)   Glucose, Bld 121 (*) 70 - 99 (mg/dL)   BUN 9  6 - 23 (mg/dL)   Creatinine, Ser 9.14  0.50 - 1.35 (mg/dL)   Calcium 8.2 (*) 8.4 - 10.5 (mg/dL)   GFR calc non Af Amer >90  >90 (mL/min)   GFR calc Af Amer >90  >90 (mL/min)  CBC      Component Value Range   WBC 8.2  4.0 - 10.5 (K/uL)   RBC 3.43 (*) 4.22 - 5.81 (MIL/uL)   Hemoglobin 10.5 (*) 13.0 - 17.0 (g/dL)   HCT 78.2 (*) 95.6 - 52.0 (%)   MCV 89.8  78.0 - 100.0 (fL)   MCH 30.6  26.0 - 34.0 (pg)   MCHC 34.1  30.0 - 36.0 (g/dL)   RDW 21.3  08.6 - 57.8 (%)   Platelets 144 (*) 150 - 400 (K/uL)   Results for orders placed in visit on 04/12/12  POCT CBC      Component Value Range   WBC 8.2  4.6 - 10.2 (K/uL)   Lymph, poc 1.8  0.6 - 3.4    POC LYMPH PERCENT 22.2  10 - 50 (%L)   MID (cbc) 0.6  0 - 0.9    POC MID % 6.8  0 - 12 (%M)   POC Granulocyte 5.8  2 - 6.9    Granulocyte percent 71.0  37 - 80 (%G)   RBC 4.57 (*) 4.69 - 6.13 (M/uL)   Hemoglobin 13.6 (*) 14.1 - 18.1 (g/dL)   HCT, POC 46.9 (*) 62.9 - 53.7 (%)   MCV  90.4  80 - 97 (fL)   MCH, POC 29.8  27 - 31.2 (pg)   MCHC 32.9  31.8 - 35.4 (g/dL)   RDW, POC 45.4     Platelet Count, POC 229  142 - 424 (K/uL)   MPV 8.5  0 - 99.8 (fL)      Assessment: Epigastric pain of uncertain etiology-could be gastritis or hiatal hernia. I really doubt this is a gallbladder problem because his never happened before and because the pain is epigastric and not in any way right-sided.  Plan: Prescription for Carafate and told to return or the emergency room if pain persists or worsens.  As patient is improving, I think we just need to stay the course and continue the carafate.  Return if pain does not resolve by thursday

## 2012-04-12 NOTE — Patient Instructions (Signed)

## 2012-04-13 LAB — COMPREHENSIVE METABOLIC PANEL
ALT: 47 U/L (ref 0–53)
AST: 47 U/L — ABNORMAL HIGH (ref 0–37)
Albumin: 4.8 g/dL (ref 3.5–5.2)
Alkaline Phosphatase: 69 U/L (ref 39–117)
BUN: 21 mg/dL (ref 6–23)
CO2: 29 mEq/L (ref 19–32)
Calcium: 9.3 mg/dL (ref 8.4–10.5)
Chloride: 102 mEq/L (ref 96–112)
Creat: 1.16 mg/dL (ref 0.50–1.35)
Glucose, Bld: 87 mg/dL (ref 70–99)
Potassium: 4.5 mEq/L (ref 3.5–5.3)
Sodium: 139 mEq/L (ref 135–145)
Total Bilirubin: 0.7 mg/dL (ref 0.3–1.2)
Total Protein: 7.4 g/dL (ref 6.0–8.3)

## 2012-08-21 ENCOUNTER — Other Ambulatory Visit: Payer: Self-pay | Admitting: Family Medicine

## 2012-08-21 DIAGNOSIS — R7989 Other specified abnormal findings of blood chemistry: Secondary | ICD-10-CM

## 2012-08-27 ENCOUNTER — Ambulatory Visit
Admission: RE | Admit: 2012-08-27 | Discharge: 2012-08-27 | Disposition: A | Discharge status: Home / Self Care | Payer: BC Managed Care – PPO | Source: Ambulatory Visit | Attending: Family Medicine | Admitting: Family Medicine

## 2012-08-27 DIAGNOSIS — R7989 Other specified abnormal findings of blood chemistry: Secondary | ICD-10-CM

## 2015-05-27 ENCOUNTER — Ambulatory Visit (INDEPENDENT_AMBULATORY_CARE_PROVIDER_SITE_OTHER): Payer: BLUE CROSS/BLUE SHIELD | Admitting: Urgent Care

## 2015-05-27 VITALS — BP 112/66 | HR 65 | Temp 98.2°F | Resp 18 | Ht 67.0 in | Wt 154.0 lb

## 2015-05-27 DIAGNOSIS — R591 Generalized enlarged lymph nodes: Secondary | ICD-10-CM | POA: Diagnosis not present

## 2015-05-27 DIAGNOSIS — J029 Acute pharyngitis, unspecified: Secondary | ICD-10-CM | POA: Diagnosis not present

## 2015-05-27 DIAGNOSIS — J020 Streptococcal pharyngitis: Secondary | ICD-10-CM | POA: Diagnosis not present

## 2015-05-27 LAB — POCT RAPID STREP A (OFFICE): Rapid Strep A Screen: POSITIVE — AB

## 2015-05-27 MED ORDER — AMOXICILLIN 875 MG PO TABS: 875.0000 mg | ORAL_TABLET | Freq: Two times a day (BID) | ORAL | Status: DC

## 2015-05-27 NOTE — Addendum Note (Signed)
Addended by: Jaynee Eagles on: 05/27/2015 06:33 PM   Modules accepted: Miquel Dunn

## 2015-05-27 NOTE — Progress Notes (Addendum)
    MRN: [id number] DOB: Nov 22, 1970  Subjective:   Roger Travis is a 43 y.o. male presenting for chief complaint of Sore Throat and Headache  Reports 4 day history of sore throat, headache, subjective fever, achy, intermittent lymph node pain, slight nausea. Has tried Zyrtec without any relief. Patient had multiple sick contacts with strep throat in close proximity for a camping/vacation trip with his family. Denies itchy eyes, red eyes, ear pain, ear drainage, sinus pain, tooth pain, cough, chest pain, shortness of breath, vomiting, abdominal pain, diarrhea. Denies any other aggravating or relieving factors, no other questions or concerns.  Roger Travis has a current medication list which includes the following prescription(s): bupropion, fish oil-omega-3 fatty acids, multivitamins ther. w/minerals, OVER THE COUNTER MEDICATION, sam-e, zolpidem, and vilazodone hcl, and the following Facility-Administered Medications: gi cocktail. He is allergic to biaxin; corn-containing products; morphine and related; vicodin; and codeine.  Roger Travis  has a past medical history of Arthritis; Recurrent upper respiratory infection (URI) (2006 or2007); and Depression. Also  has past surgical history that includes Total hip arthroplasty (10/11/2011); Lipoma excision (10/11/2011); and Joint replacement.  ROS As in subjective.  Objective:   Vitals: BP 112/66 mmHg  Pulse 65  Temp(Src) 98.2 F (36.8 C) (Oral)  Resp 18  Ht 5\' 7"  (1.702 m)  Wt 154 lb (69.854 kg)  BMI 24.11 kg/m2  SpO2 98%  Physical Exam  Constitutional: He is oriented to person, place, and time. He appears well-developed and well-nourished.  HENT:  TM's intact bilaterally, no effusions or erythema. Nasal turbinates pink and moist with mucus. No sinus tenderness. Postnasal drip present with slight erythema but without oropharyngeal exudates or abscesses.  Eyes: Conjunctivae are normal. Right eye exhibits no discharge. Left eye  exhibits no discharge. No scleral icterus.  Neck: Normal range of motion.  Cardiovascular: Normal rate, regular rhythm and intact distal pulses.  Exam reveals no gallop and no friction rub.   No murmur heard. Pulmonary/Chest: No stridor. No respiratory distress. He has no wheezes. He has no rales.  Lymphadenopathy:    He has cervical adenopathy (anterior, bilateral).  Neurological: He is alert and oriented to person, place, and time.  Skin: Skin is warm and dry. No rash noted. No erythema. No pallor.    Results for orders placed or performed in visit on 05/27/15 (from the past 24 hour(s))  POCT rapid strep A     Status: Abnormal   Collection Time: 05/27/15  8:58 AM  Result Value Ref Range   Rapid Strep A Screen Positive (A) Negative   Assessment and Plan :   1. Sore throat 2. Lymphadenopathy 3. Strep throat - Start amoxicillin for Charcot, ibuprofen or Motrin for sore throat. Advised that he have his children and wife checked as well. Return to clinic as needed.  Jaynee Eagles, PA-C [facility] [phone] 05/27/2015 8:30 AM

## 2015-05-27 NOTE — Patient Instructions (Signed)

## 2020-03-08 ENCOUNTER — Encounter (HOSPITAL_COMMUNITY): Payer: Self-pay

## 2020-03-08 ENCOUNTER — Other Ambulatory Visit: Payer: Self-pay

## 2020-03-08 ENCOUNTER — Ambulatory Visit (INDEPENDENT_AMBULATORY_CARE_PROVIDER_SITE_OTHER): Payer: 59

## 2020-03-08 ENCOUNTER — Ambulatory Visit (HOSPITAL_COMMUNITY)
Admission: EM | Admit: 2020-03-08 | Discharge: 2020-03-08 | Disposition: A | Discharge status: Home / Self Care | Payer: 59 | Attending: Internal Medicine | Admitting: Internal Medicine

## 2020-03-08 DIAGNOSIS — G44309 Post-traumatic headache, unspecified, not intractable: Secondary | ICD-10-CM

## 2020-03-08 DIAGNOSIS — M542 Cervicalgia: Secondary | ICD-10-CM

## 2020-03-08 DIAGNOSIS — M25512 Pain in left shoulder: Secondary | ICD-10-CM

## 2020-03-08 MED ORDER — IBUPROFEN 600 MG PO TABS: 600.0000 mg | ORAL_TABLET | Freq: Four times a day (QID) | ORAL | 0 refills | Status: AC | PRN

## 2020-03-08 MED ORDER — CYCLOBENZAPRINE HCL 10 MG PO TABS: 10.0000 mg | ORAL_TABLET | Freq: Three times a day (TID) | ORAL | 0 refills | Status: AC | PRN

## 2020-03-08 MED ORDER — KETOROLAC TROMETHAMINE 30 MG/ML IJ SOLN
INTRAMUSCULAR | Status: AC
  Filled 2020-03-08: qty 1

## 2020-03-08 MED ORDER — KETOROLAC TROMETHAMINE 30 MG/ML IJ SOLN
30.0000 mg | Freq: Once | INTRAMUSCULAR | Status: AC
  Administered 2020-03-08: 30 mg via INTRAMUSCULAR
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
## 2020-06-29 ENCOUNTER — Other Ambulatory Visit: Payer: Self-pay

## 2020-06-29 ENCOUNTER — Ambulatory Visit (INDEPENDENT_AMBULATORY_CARE_PROVIDER_SITE_OTHER): Payer: No Typology Code available for payment source | Admitting: Otolaryngology

## 2020-06-29 VITALS — Temp 97.5°F

## 2020-06-29 DIAGNOSIS — H9313 Tinnitus, bilateral: Secondary | ICD-10-CM | POA: Diagnosis not present

## 2020-06-29 NOTE — Progress Notes (Signed)
HPI: Roger Travis is a 49 y.o. male who presents is referred by Dr. Kenton Kingfisher for evaluation of chronic tinnitus and hearing loss.  Patient states that he has had tinnitus in his ears since he was a child.  Only recently since wearing the mask as he had more difficulty hearing people adequately.  This has been more of a gradual onset hearing difficulty.  He states that he had a hearing test performed about 10 years ago that showed mild hearing loss but he was not recommended hearing aids or any specific therapy.  Past Medical History:  Diagnosis Date  . Arthritis    right hip  . Depression   . Recurrent upper respiratory infection (URI) 2006 or2007   walking pneumonia   Past Surgical History:  Procedure Laterality Date  . JOINT REPLACEMENT    . LIPOMA EXCISION  10/11/2011   Procedure: EXCISION LIPOMA;  Surgeon: Mcarthur Rossetti;  Location: [facility];  Service: Orthopedics;  Laterality: Right;  . TOTAL HIP ARTHROPLASTY  10/11/2011   Procedure: TOTAL HIP ARTHROPLASTY ANTERIOR APPROACH;  Surgeon: Mcarthur Rossetti;  Location: [facility];  Service: Orthopedics;  Laterality: Right;   Social History   Socioeconomic History  . Marital status: Married    Spouse name: Not on file  . Number of children: Not on file  . Years of education: Not on file  . Highest education level: Not on file  Occupational History  . Not on file  Tobacco Use  . Smoking status: Never Smoker  . Smokeless tobacco: Never Used  Vaping Use  . Vaping Use: Never used  Substance and Sexual Activity  . Alcohol use: Yes    Alcohol/week: 1.0 standard drink    Types: 1 Glasses of wine per week  . Drug use: No  . Sexual activity: Not on file  Other Topics Concern  . Not on file  Social History Narrative  . Not on file   Social Determinants of Health   Financial Resource Strain:   . Difficulty of Paying Living Expenses:   Food Insecurity:   . Worried About Charity fundraiser in the Last Year:   . Arts development officer in the Last Year:   Transportation Needs:   . Film/video editor (Medical):   Marland Kitchen Lack of Transportation (Non-Medical):   Physical Activity:   . Days of Exercise per Week:   . Minutes of Exercise per Session:   Stress:   . Feeling of Stress :   Social Connections:   . Frequency of Communication with Friends and Family:   . Frequency of Social Gatherings with Friends and Family:   . Attends Religious Services:   . Active Member of Clubs or Organizations:   . Attends Archivist Meetings:   Marland Kitchen Marital Status:    Family History  Problem Relation Age of Onset  . Cancer Paternal Grandfather    Allergies  Allergen Reactions  . Biaxin [Clarithromycin] Nausea And Vomiting  . Corn-Containing Products Other (See Comments)    headache  . Morphine And Related   . Vicodin [Hydrocodone-Acetaminophen] Nausea And Vomiting  . Codeine Nausea And Vomiting   Prior to Admission medications   Medication Sig Start Date End Date Taking? Authorizing Provider  amoxicillin (AMOXIL) 875 MG tablet Take 1 tablet (875 mg total) by mouth 2 (two) times daily. 05/27/15   Jaynee Eagles, PA-C  buPROPion (WELLBUTRIN XL) 150 MG 24 hr tablet Take 150 mg by mouth daily.  [provider]  cyclobenzaprine (FLEXERIL) 10 MG tablet Take 1 tablet (10 mg total) by mouth 3 (three) times daily as needed for muscle spasms. 03/08/20   Lamptey, Myrene Galas, MD  fish oil-omega-3 fatty acids 1000 MG capsule Take 1 g by mouth 2 (two) times daily.     [provider]  ibuprofen (ADVIL) 600 MG tablet Take 1 tablet (600 mg total) by mouth every 6 (six) hours as needed. 03/08/20   Lamptey, Myrene Galas, MD  Multiple Vitamins-Minerals (MULTIVITAMINS THER. W/MINERALS) TABS Take 1 tablet by mouth daily.     [provider]  OVER THE COUNTER MEDICATION Take 1 tablet by mouth daily. 5-Hydroxytryptophan (5-HTP),    [provider]  S-Adenosylmethionine (SAM-E) 400 MG TABS Take 1 tablet by mouth  daily.     [provider]  zolpidem (AMBIEN) 5 MG tablet Take 5 mg by mouth at bedtime as needed. Sleep     [provider]     Positive ROS: Otherwise negative  All other systems have been reviewed and were otherwise negative with the exception of those mentioned in the HPI and as above.  Physical Exam: Constitutional: Alert, well-appearing, no acute distress Ears: External ears without lesions or tenderness. Ear canals are clear bilaterally.  TMs are clear bilaterally. Nasal: External nose without lesions. Clear nasal passages Oral: Lips and gums without lesions. Tongue and palate mucosa without lesions. Posterior oropharynx clear. Neck: No palpable adenopathy or masses Respiratory: Breathing comfortably  Skin: No facial/neck lesions or rash noted.   Audiogram demonstrated normal hearing in both ears with SRT's of 5 dB bilaterally.  No upper frequency sensorineural hearing loss noted.  Tight A tympanograms bilaterally.  Procedures  Assessment: Bilateral tinnitus questionable etiology. Normal hearing assessment.  Plan: Gave him samples of Lipo flavonoid to try as this is beneficial in some people with tinnitus. Also gave him information on the [facility] tinnitus clinic with the audiology department.   Radene Journey, MD   CC:

## 2020-06-30 ENCOUNTER — Encounter (INDEPENDENT_AMBULATORY_CARE_PROVIDER_SITE_OTHER): Payer: Self-pay

## 2020-08-28 ENCOUNTER — Other Ambulatory Visit (HOSPITAL_COMMUNITY): Payer: Self-pay | Admitting: Orthopedic Surgery

## 2020-08-28 ENCOUNTER — Encounter (HOSPITAL_BASED_OUTPATIENT_CLINIC_OR_DEPARTMENT_OTHER): Payer: Self-pay | Admitting: Orthopedic Surgery

## 2020-08-28 ENCOUNTER — Other Ambulatory Visit: Payer: Self-pay

## 2020-08-29 ENCOUNTER — Other Ambulatory Visit (HOSPITAL_COMMUNITY)
Admission: RE | Admit: 2020-08-29 | Discharge: 2020-08-29 | Disposition: A | Discharge status: Home / Self Care | Payer: 59 | Source: Ambulatory Visit | Attending: Orthopedic Surgery | Admitting: Orthopedic Surgery

## 2020-08-29 DIAGNOSIS — Z20822 Contact with and (suspected) exposure to covid-19: Secondary | ICD-10-CM | POA: Insufficient documentation

## 2020-08-29 DIAGNOSIS — Z01812 Encounter for preprocedural laboratory examination: Secondary | ICD-10-CM | POA: Diagnosis present

## 2020-08-29 LAB — SARS CORONAVIRUS 2 (TAT 6-24 HRS): SARS Coronavirus 2: NEGATIVE

## 2020-08-31 ENCOUNTER — Ambulatory Visit (HOSPITAL_BASED_OUTPATIENT_CLINIC_OR_DEPARTMENT_OTHER): Payer: 59 | Admitting: Anesthesiology

## 2020-08-31 ENCOUNTER — Other Ambulatory Visit: Payer: Self-pay

## 2020-08-31 ENCOUNTER — Ambulatory Visit (HOSPITAL_BASED_OUTPATIENT_CLINIC_OR_DEPARTMENT_OTHER)
Admission: RE | Admit: 2020-08-31 | Discharge: 2020-08-31 | Disposition: A | Discharge status: Home / Self Care | Payer: 59 | Attending: Orthopedic Surgery | Admitting: Orthopedic Surgery

## 2020-08-31 ENCOUNTER — Encounter (HOSPITAL_BASED_OUTPATIENT_CLINIC_OR_DEPARTMENT_OTHER): Payer: Self-pay | Admitting: Orthopedic Surgery

## 2020-08-31 ENCOUNTER — Encounter (HOSPITAL_BASED_OUTPATIENT_CLINIC_OR_DEPARTMENT_OTHER)
Admission: RE | Disposition: A | Discharge status: Home / Self Care | Payer: Self-pay | Source: Home / Self Care | Attending: Orthopedic Surgery

## 2020-08-31 DIAGNOSIS — Z79899 Other long term (current) drug therapy: Secondary | ICD-10-CM | POA: Insufficient documentation

## 2020-08-31 DIAGNOSIS — X58XXXA Exposure to other specified factors, initial encounter: Secondary | ICD-10-CM | POA: Insufficient documentation

## 2020-08-31 DIAGNOSIS — Y93A5 Activity, obstacle course: Secondary | ICD-10-CM | POA: Insufficient documentation

## 2020-08-31 DIAGNOSIS — Z96641 Presence of right artificial hip joint: Secondary | ICD-10-CM | POA: Diagnosis not present

## 2020-08-31 DIAGNOSIS — F419 Anxiety disorder, unspecified: Secondary | ICD-10-CM | POA: Insufficient documentation

## 2020-08-31 DIAGNOSIS — S86012A Strain of left Achilles tendon, initial encounter: Secondary | ICD-10-CM | POA: Insufficient documentation

## 2020-08-31 DIAGNOSIS — F32A Depression, unspecified: Secondary | ICD-10-CM | POA: Diagnosis not present

## 2020-08-31 DIAGNOSIS — M216X2 Other acquired deformities of left foot: Secondary | ICD-10-CM | POA: Diagnosis not present

## 2020-08-31 HISTORY — DX: Anxiety disorder, unspecified: F41.9

## 2020-08-31 HISTORY — PX: EXCISION HAGLUND'S DEFORMITY WITH ACHILLES TENDON REPAIR: SHX5627

## 2020-08-31 SURGERY — EXCISION HAGLUND'S DEFORMITY WITH ACHILLES TENDON REPAIR
Anesthesia: General | Site: Foot | Laterality: Left

## 2020-08-31 MED ORDER — MIDAZOLAM HCL 2 MG/2ML IJ SOLN
INTRAMUSCULAR | Status: AC
  Filled 2020-08-31: qty 2

## 2020-08-31 MED ORDER — PROMETHAZINE HCL 12.5 MG PO TABS: 12.5000 mg | ORAL_TABLET | Freq: Four times a day (QID) | ORAL | 0 refills | Status: AC | PRN

## 2020-08-31 MED ORDER — OXYCODONE HCL 5 MG/5ML PO SOLN: 5.0000 mg | Freq: Once | ORAL | Status: DC | PRN

## 2020-08-31 MED ORDER — PROPOFOL 10 MG/ML IV BOLUS
INTRAVENOUS | Status: DC | PRN
  Administered 2020-08-31: 200 mg via INTRAVENOUS

## 2020-08-31 MED ORDER — BUPIVACAINE-EPINEPHRINE (PF) 0.5% -1:200000 IJ SOLN
INTRAMUSCULAR | Status: DC | PRN
  Administered 2020-08-31: 25 mL via PERINEURAL
  Administered 2020-08-31: 15 mL via PERINEURAL

## 2020-08-31 MED ORDER — SUGAMMADEX SODIUM 500 MG/5ML IV SOLN
INTRAVENOUS | Status: AC
  Filled 2020-08-31: qty 5

## 2020-08-31 MED ORDER — ROCURONIUM BROMIDE 100 MG/10ML IV SOLN
INTRAVENOUS | Status: DC | PRN
  Administered 2020-08-31: 70 mg via INTRAVENOUS

## 2020-08-31 MED ORDER — FENTANYL CITRATE (PF) 100 MCG/2ML IJ SOLN: 25.0000 ug | INTRAMUSCULAR | Status: DC | PRN

## 2020-08-31 MED ORDER — VANCOMYCIN HCL 500 MG IV SOLR
INTRAVENOUS | Status: AC
  Filled 2020-08-31: qty 500

## 2020-08-31 MED ORDER — MIDAZOLAM HCL 2 MG/2ML IJ SOLN
2.0000 mg | Freq: Once | INTRAMUSCULAR | Status: AC
  Administered 2020-08-31: 2 mg via INTRAVENOUS

## 2020-08-31 MED ORDER — CEFAZOLIN SODIUM-DEXTROSE 2-4 GM/100ML-% IV SOLN
2.0000 g | INTRAVENOUS | Status: AC
  Administered 2020-08-31: 2 g via INTRAVENOUS

## 2020-08-31 MED ORDER — ONDANSETRON HCL 4 MG/2ML IJ SOLN
INTRAMUSCULAR | Status: AC
  Filled 2020-08-31: qty 18

## 2020-08-31 MED ORDER — DOCUSATE SODIUM 100 MG PO CAPS: 100.0000 mg | ORAL_CAPSULE | Freq: Two times a day (BID) | ORAL | 0 refills | Status: AC

## 2020-08-31 MED ORDER — FENTANYL CITRATE (PF) 100 MCG/2ML IJ SOLN
100.0000 ug | Freq: Once | INTRAMUSCULAR | Status: AC
  Administered 2020-08-31: 100 ug via INTRAVENOUS

## 2020-08-31 MED ORDER — VANCOMYCIN HCL 500 MG IV SOLR
INTRAVENOUS | Status: DC | PRN
  Administered 2020-08-31: 500 mg via TOPICAL

## 2020-08-31 MED ORDER — LACTATED RINGERS IV SOLN: INTRAVENOUS | Status: DC

## 2020-08-31 MED ORDER — DEXAMETHASONE SODIUM PHOSPHATE 10 MG/ML IJ SOLN
INTRAMUSCULAR | Status: AC
  Filled 2020-08-31: qty 2

## 2020-08-31 MED ORDER — ONDANSETRON HCL 4 MG/2ML IJ SOLN: 4.0000 mg | Freq: Four times a day (QID) | INTRAMUSCULAR | Status: DC | PRN

## 2020-08-31 MED ORDER — EPHEDRINE 5 MG/ML INJ
INTRAVENOUS | Status: AC
  Filled 2020-08-31: qty 10

## 2020-08-31 MED ORDER — ONDANSETRON HCL 4 MG/2ML IJ SOLN
INTRAMUSCULAR | Status: DC | PRN
  Administered 2020-08-31: 4 mg via INTRAVENOUS

## 2020-08-31 MED ORDER — LIDOCAINE 2% (20 MG/ML) 5 ML SYRINGE
INTRAMUSCULAR | Status: AC
  Filled 2020-08-31: qty 10

## 2020-08-31 MED ORDER — FENTANYL CITRATE (PF) 100 MCG/2ML IJ SOLN
INTRAMUSCULAR | Status: AC
Start: 2020-08-31 — End: ?
  Filled 2020-08-31: qty 2

## 2020-08-31 MED ORDER — OXYCODONE HCL 5 MG PO TABS: 5.0000 mg | ORAL_TABLET | Freq: Once | ORAL | Status: DC | PRN

## 2020-08-31 MED ORDER — 0.9 % SODIUM CHLORIDE (POUR BTL) OPTIME
TOPICAL | Status: DC | PRN
  Administered 2020-08-31: 120 mL

## 2020-08-31 MED ORDER — CEFAZOLIN SODIUM-DEXTROSE 2-4 GM/100ML-% IV SOLN
INTRAVENOUS | Status: AC
  Filled 2020-08-31: qty 100

## 2020-08-31 MED ORDER — ASPIRIN EC 81 MG PO TBEC: 81.0000 mg | DELAYED_RELEASE_TABLET | Freq: Two times a day (BID) | ORAL | 0 refills | Status: AC

## 2020-08-31 MED ORDER — LIDOCAINE HCL (CARDIAC) PF 100 MG/5ML IV SOSY
PREFILLED_SYRINGE | INTRAVENOUS | Status: DC | PRN
  Administered 2020-08-31: 30 mg via INTRAVENOUS

## 2020-08-31 MED ORDER — PROPOFOL 500 MG/50ML IV EMUL
INTRAVENOUS | Status: AC
  Filled 2020-08-31: qty 150

## 2020-08-31 MED ORDER — DEXAMETHASONE SODIUM PHOSPHATE 4 MG/ML IJ SOLN
INTRAMUSCULAR | Status: DC | PRN
  Administered 2020-08-31: 4 mg via INTRAVENOUS

## 2020-08-31 MED ORDER — FENTANYL CITRATE (PF) 100 MCG/2ML IJ SOLN
INTRAMUSCULAR | Status: DC | PRN
Start: 2020-08-31 — End: 2020-08-31
  Administered 2020-08-31: 100 ug via INTRAVENOUS

## 2020-08-31 MED ORDER — LIDOCAINE 2% (20 MG/ML) 5 ML SYRINGE
INTRAMUSCULAR | Status: AC
  Filled 2020-08-31: qty 15

## 2020-08-31 MED ORDER — FENTANYL CITRATE (PF) 100 MCG/2ML IJ SOLN
INTRAMUSCULAR | Status: AC
  Filled 2020-08-31: qty 2

## 2020-08-31 MED ORDER — SODIUM CHLORIDE 0.9 % IV SOLN: INTRAVENOUS | Status: DC

## 2020-08-31 MED ORDER — SENNA 8.6 MG PO TABS: 2.0000 | ORAL_TABLET | Freq: Two times a day (BID) | ORAL | 0 refills | Status: AC

## 2020-08-31 MED ORDER — ROCURONIUM BROMIDE 10 MG/ML (PF) SYRINGE
PREFILLED_SYRINGE | INTRAVENOUS | Status: AC
  Filled 2020-08-31: qty 10

## 2020-08-31 MED ORDER — OXYCODONE HCL 5 MG PO TABS: 5.0000 mg | ORAL_TABLET | ORAL | 0 refills | Status: AC | PRN

## 2020-08-31 SURGICAL SUPPLY — 93 items
ANCH SUT 2 2.9 2 LD TPR NDL (Anchor) ×2 IMPLANT
ANCHOR JUGGERKNOT WTAP NDL 2.9 (Anchor) ×3 IMPLANT
APL PRP STRL LF DISP 70% ISPRP (MISCELLANEOUS) ×2
BANDAGE ESMARK 6X9 LF (GAUZE/BANDAGES/DRESSINGS) ×2 IMPLANT
BLADE ARTHRO LOK 4 BEAVER (BLADE) IMPLANT
BLADE AVERAGE 25X9 (BLADE) IMPLANT
BLADE MICRO SAGITTAL (BLADE) ×2 IMPLANT
BLADE OSC/SAG .038X5.5 CUT EDG (BLADE) IMPLANT
BLADE SURG 15 STRL LF DISP TIS (BLADE) ×6 IMPLANT
BLADE SURG 15 STRL SS (BLADE) ×9
BNDG CMPR 9X6 STRL LF SNTH (GAUZE/BANDAGES/DRESSINGS) ×2
BNDG COHESIVE 4X5 TAN STRL (GAUZE/BANDAGES/DRESSINGS) ×3 IMPLANT
BNDG COHESIVE 6X5 TAN STRL LF (GAUZE/BANDAGES/DRESSINGS) ×3 IMPLANT
BNDG CONFORM 2 STRL LF (GAUZE/BANDAGES/DRESSINGS) IMPLANT
BNDG ESMARK 6X9 LF (GAUZE/BANDAGES/DRESSINGS) ×3
BOOT STEPPER DURA LG (SOFTGOODS) IMPLANT
BOOT STEPPER DURA MED (SOFTGOODS) IMPLANT
CANISTER SUCT 1200ML W/VALVE (MISCELLANEOUS) IMPLANT
CHLORAPREP W/TINT 26 (MISCELLANEOUS) ×3 IMPLANT
COVER BACK TABLE 60X90IN (DRAPES) ×3 IMPLANT
COVER WAND RF STERILE (DRAPES) IMPLANT
CUFF TOURN SGL QUICK 34 (TOURNIQUET CUFF)
CUFF TRNQT CYL 34X4.125X (TOURNIQUET CUFF) IMPLANT
DECANTER SPIKE VIAL GLASS SM (MISCELLANEOUS) IMPLANT
DRAPE EXTREMITY T 121X128X90 (DISPOSABLE) ×3 IMPLANT
DRAPE OEC MINIVIEW 54X84 (DRAPES) IMPLANT
DRAPE U-SHAPE 47X51 STRL (DRAPES) ×3 IMPLANT
DRSG MEPITEL 4X7.2 (GAUZE/BANDAGES/DRESSINGS) ×3 IMPLANT
DRSG PAD ABDOMINAL 8X10 ST (GAUZE/BANDAGES/DRESSINGS) ×6 IMPLANT
ELECT REM PT RETURN 9FT ADLT (ELECTROSURGICAL) ×3
ELECTRODE REM PT RTRN 9FT ADLT (ELECTROSURGICAL) ×2 IMPLANT
GAUZE SPONGE 4X4 12PLY STRL (GAUZE/BANDAGES/DRESSINGS) ×3 IMPLANT
GLOVE BIO SURGEON STRL SZ8 (GLOVE) ×3 IMPLANT
GLOVE BIOGEL PI IND STRL 7.0 (GLOVE) ×1 IMPLANT
GLOVE BIOGEL PI IND STRL 7.5 (GLOVE) ×1 IMPLANT
GLOVE BIOGEL PI IND STRL 8 (GLOVE) ×4 IMPLANT
GLOVE BIOGEL PI INDICATOR 7.0 (GLOVE) ×1
GLOVE BIOGEL PI INDICATOR 7.5 (GLOVE) ×1
GLOVE BIOGEL PI INDICATOR 8 (GLOVE) ×2
GLOVE ECLIPSE 8.0 STRL XLNG CF (GLOVE) ×3 IMPLANT
GLOVE SURG SS PI 7.0 STRL IVOR (GLOVE) ×3 IMPLANT
GLOVE SURG SS PI 7.5 STRL IVOR (GLOVE) ×2 IMPLANT
GOWN STRL REUS W/ TWL LRG LVL3 (GOWN DISPOSABLE) ×2 IMPLANT
GOWN STRL REUS W/ TWL XL LVL3 (GOWN DISPOSABLE) ×5 IMPLANT
GOWN STRL REUS W/TWL LRG LVL3 (GOWN DISPOSABLE) ×3
GOWN STRL REUS W/TWL XL LVL3 (GOWN DISPOSABLE) ×9
KIT BIO-TENODESIS 3X8 DISP (MISCELLANEOUS)
KIT INSRT BABSR STRL DISP BTN (MISCELLANEOUS) IMPLANT
NDL SAFETY ECLIPSE 18X1.5 (NEEDLE) IMPLANT
NDL SUT 6 .5 CRC .975X.05 MAYO (NEEDLE) IMPLANT
NEEDLE HYPO 18GX1.5 SHARP (NEEDLE)
NEEDLE HYPO 22GX1.5 SAFETY (NEEDLE) IMPLANT
NEEDLE MAYO TAPER (NEEDLE)
NS IRRIG 1000ML POUR BTL (IV SOLUTION) ×3 IMPLANT
PACK BASIN DAY SURGERY FS (CUSTOM PROCEDURE TRAY) ×3 IMPLANT
PAD CAST 4YDX4 CTTN HI CHSV (CAST SUPPLIES) ×2 IMPLANT
PADDING CAST ABS 4INX4YD NS (CAST SUPPLIES)
PADDING CAST ABS COTTON 4X4 ST (CAST SUPPLIES) IMPLANT
PADDING CAST COTTON 4X4 STRL (CAST SUPPLIES) ×3
PADDING CAST COTTON 6X4 STRL (CAST SUPPLIES) ×3 IMPLANT
PASSER SUT SWANSON 36MM LOOP (INSTRUMENTS) IMPLANT
PENCIL SMOKE EVACUATOR (MISCELLANEOUS) ×3 IMPLANT
RETRIEVER SUT HEWSON (MISCELLANEOUS) IMPLANT
SANITIZER HAND PURELL 535ML FO (MISCELLANEOUS) ×3 IMPLANT
SHEET MEDIUM DRAPE 40X70 STRL (DRAPES) ×3 IMPLANT
SLEEVE SCD COMPRESS KNEE MED (MISCELLANEOUS) ×3 IMPLANT
SPLINT FAST PLASTER 5X30 (CAST SUPPLIES) ×20
SPLINT PLASTER CAST FAST 5X30 (CAST SUPPLIES) ×40 IMPLANT
SPONGE LAP 18X18 RF (DISPOSABLE) ×3 IMPLANT
STAPLER VISISTAT 35W (STAPLE) IMPLANT
STOCKINETTE 6  STRL (DRAPES) ×3
STOCKINETTE 6 STRL (DRAPES) ×2 IMPLANT
SUCTION FRAZIER HANDLE 10FR (MISCELLANEOUS)
SUCTION TUBE FRAZIER 10FR DISP (MISCELLANEOUS) IMPLANT
SUT ETHIBOND 2 OS 4 DA (SUTURE) IMPLANT
SUT ETHIBOND 3-0 V-5 (SUTURE) IMPLANT
SUT ETHILON 3 0 PS 1 (SUTURE) ×5 IMPLANT
SUT FIBERWIRE #2 38 T-5 BLUE (SUTURE)
SUT MERSILENE 2.0 SH NDLE (SUTURE) IMPLANT
SUT MNCRL AB 3-0 PS2 18 (SUTURE) ×3 IMPLANT
SUT VIC AB 0 CT1 27 (SUTURE)
SUT VIC AB 0 CT1 27XBRD ANBCTR (SUTURE) IMPLANT
SUT VIC AB 0 SH 27 (SUTURE) ×4 IMPLANT
SUT VIC AB 1 CT1 27 (SUTURE)
SUT VIC AB 1 CT1 27XBRD ANBCTR (SUTURE) IMPLANT
SUT VIC AB 2-0 SH 18 (SUTURE) IMPLANT
SUT VIC AB 2-0 SH 27 (SUTURE) ×3
SUT VIC AB 2-0 SH 27XBRD (SUTURE) ×2 IMPLANT
SUTURE FIBERWR #2 38 T-5 BLUE (SUTURE) IMPLANT
SYR BULB EAR ULCER 3OZ GRN STR (SYRINGE) ×3 IMPLANT
TOWEL GREEN STERILE FF (TOWEL DISPOSABLE) ×6 IMPLANT
TUBE CONNECTING 20X1/4 (TUBING) IMPLANT
UNDERPAD 30X36 HEAVY ABSORB (UNDERPADS AND DIAPERS) ×3 IMPLANT

## 2020-08-31 NOTE — Transfer of Care (Signed)
Immediate Anesthesia Transfer of Care Note  Patient: Rodd Heft Hollerbach  Procedure(s) Performed: Left Achilles tendon reconstruction (Left Foot)  Patient Location: PACU  Anesthesia Type:GA combined with regional for post-op pain  Level of Consciousness: drowsy and patient cooperative  Airway & Oxygen Therapy: Patient Spontanous Breathing and Patient connected to face mask oxygen  Post-op Assessment: Report given to RN and Post -op Vital signs reviewed and stable  Post vital signs: Reviewed and stable  Last Vitals:  Vitals Value Taken Time  BP 121/71 08/31/20 1139  Temp    Pulse 73 08/31/20 1140  Resp    SpO2 100 % 08/31/20 1140  Vitals shown include unvalidated device data.  Last Pain:  Vitals:   08/31/20 0857  PainSc: 0-No pain      Patients Stated Pain Goal: 5 (78/67/54 4920)  Complications: No complications documented.

## 2020-08-31 NOTE — Anesthesia Postprocedure Evaluation (Signed)
Anesthesia Post Note  Patient: Roger Travis  Procedure(s) Performed: Left Achilles tendon reconstruction (Left Foot)     Patient location during evaluation: PACU Anesthesia Type: General Level of consciousness: awake and alert Pain management: pain level controlled Vital Signs Assessment: post-procedure vital signs reviewed and stable Respiratory status: spontaneous breathing, nonlabored ventilation, respiratory function stable and patient connected to nasal cannula oxygen Cardiovascular status: blood pressure returned to baseline and stable Postop Assessment: no apparent nausea or vomiting Anesthetic complications: no   No complications documented.  Last Vitals:  Vitals:   08/31/20 1142 08/31/20 1222  BP: 121/71 130/87  Pulse: 70 77  Resp: 12 16  Temp: (!) 36.4 C (!) 36.2 C  SpO2: 100% 100%    Last Pain:  Vitals:   08/31/20 1222  PainSc: 0-No pain                 Braelynn Lupton S

## 2020-08-31 NOTE — Discharge Instructions (Addendum)
Roger Simmer, MD [facility]  Please read the following information regarding your care after surgery.  Medications  You only need a prescription for the narcotic pain medicine (ex. oxycodone, Percocet, Norco).  All of the other medicines listed below are available over the counter. X Aleve 2 pills twice a day for the first 3 days after surgery. X acetominophen (Tylenol) 650 mg every 4-6 hours as you need for minor to moderate pain X oxycodone as prescribed for severe pain X Phenergan as prescribed for nausea  Narcotic pain medicine (ex. oxycodone, Percocet, Vicodin) will cause constipation.  To prevent this problem, take the following medicines while you are taking any pain medicine. X docusate sodium (Colace) 100 mg twice a day X senna (Senokot) 2 tablets twice a day  X To help prevent blood clots, take a baby aspirin (81 mg) twice a day for two weeks after surgery.  You should also get up every hour while you are awake to move around.    Weight Bearing X Do not bear any weight on the operated leg or foot.  Cast / Splint / Dressing X Keep your splint, cast or dressing clean and dry.  Don't put anything (coat hanger, pencil, etc) down inside of it.  If it gets damp, use a hair dryer on the cool setting to dry it.  If it gets soaked, call the office to schedule an appointment for a cast change.   After your dressing, cast or splint is removed; you may shower, but do not soak or scrub the wound.  Allow the water to run over it, and then gently pat it dry.  Swelling It is normal for you to have swelling where you had surgery.  To reduce swelling and pain, keep your toes above your nose for at least 3 days after surgery.  It may be necessary to keep your foot or leg elevated for several weeks.  If it hurts, it should be elevated.  Follow Up Call my office at [phone] when you are discharged from the hospital or surgery center to schedule an appointment to be seen two weeks after  surgery.  Call my office at [phone] if you develop a fever >101.5 F, nausea, vomiting, bleeding from the surgical site or severe pain.   Post Anesthesia Home Care Instructions  Activity: Get plenty of rest for the remainder of the day. A responsible individual must stay with you for 24 hours following the procedure.  For the next 24 hours, DO NOT: -Drive a car -Paediatric nurse -Drink alcoholic beverages -Take any medication unless instructed by your physician -Make any legal decisions or sign important papers.  Meals: Start with liquid foods such as gelatin or soup. Progress to regular foods as tolerated. Avoid greasy, spicy, heavy foods. If nausea and/or vomiting occur, drink only clear liquids until the nausea and/or vomiting subsides. Call your physician if vomiting continues.  Special Instructions/Symptoms: Your throat may feel dry or sore from the anesthesia or the breathing tube placed in your throat during surgery. If this causes discomfort, gargle with warm salt water. The discomfort should disappear within 24 hours.  If you had a scopolamine patch placed behind your ear for the management of post- operative nausea and/or vomiting:  1. The medication in the patch is effective for 72 hours, after which it should be removed.  Wrap patch in a tissue and discard in the trash. Wash hands thoroughly with soap and water. 2. You may remove the patch earlier than 72  hours if you experience unpleasant side effects which may include dry mouth, dizziness or visual disturbances. 3. Avoid touching the patch. Wash your hands with soap and water after contact with the patch.  Regional Anesthesia Blocks  1. Numbness or the inability to move the "blocked" extremity may last from 3-48 hours after placement. The length of time depends on the medication injected and your individual response to the medication. If the numbness is not going away after 48 hours, call your surgeon.  2. The  extremity that is blocked will need to be protected until the numbness is gone and the  Strength has returned. Because you cannot feel it, you will need to take extra care to avoid injury. Because it may be weak, you may have difficulty moving it or using it. You may not know what position it is in without looking at it while the block is in effect.  3. For blocks in the legs and feet, returning to weight bearing and walking needs to be done carefully. You will need to wait until the numbness is entirely gone and the strength has returned. You should be able to move your leg and foot normally before you try and bear weight or walk. You will need someone to be with you when you first try to ensure you do not fall and possibly risk injury.  4. Bruising and tenderness at the needle site are common side effects and will resolve in a few days.  5. Persistent numbness or new problems with movement should be communicated to the surgeon or the [place] [phone] [place] [phone]).

## 2020-08-31 NOTE — Anesthesia Procedure Notes (Signed)
Procedure Name: Intubation Date/Time: 08/31/2020 10:19 AM Performed by: Signe Colt, CRNA Pre-anesthesia Checklist: Patient identified, Emergency Drugs available, Suction available and Patient being monitored Patient Re-evaluated:Patient Re-evaluated prior to induction Oxygen Delivery Method: Circle system utilized Preoxygenation: Pre-oxygenation with 100% oxygen Induction Type: IV induction Ventilation: Mask ventilation without difficulty Laryngoscope Size: Mac and 3 Grade View: Grade I Tube type: Oral Tube size: 7.0 mm Number of attempts: 1 Airway Equipment and Method: Stylet and Oral airway Placement Confirmation: ETT inserted through vocal cords under direct vision,  positive ETCO2 and breath sounds checked- equal and bilateral Secured at: 21 cm Tube secured with: Tape Dental Injury: Teeth and Oropharynx as per pre-operative assessment

## 2020-08-31 NOTE — H&P (Signed)
Roger Travis is an 49 y.o. male.   Chief Complaint:  Left ankle pain HPI: The patient is a 49 year old male without significant past medical history.  He was participating in an obstacle race just over a week ago when he injured his left ankle.  He ruptured his Achilles tendon avulsing it through an area of chronic degeneration at the calcaneus.  He presents now for operative treatment of this injury.  Past Medical History:  Diagnosis Date  . Anxiety   . Arthritis    right hip  . Depression   . Recurrent upper respiratory infection (URI) 2006 or2007   walking pneumonia    Past Surgical History:  Procedure Laterality Date  . JOINT REPLACEMENT    . LIPOMA EXCISION  10/11/2011   Procedure: EXCISION LIPOMA;  Surgeon: Mcarthur Rossetti;  Location: [facility];  Service: Orthopedics;  Laterality: Right;  . TOTAL HIP ARTHROPLASTY  10/11/2011   Procedure: TOTAL HIP ARTHROPLASTY ANTERIOR APPROACH;  Surgeon: Mcarthur Rossetti;  Location: [facility];  Service: Orthopedics;  Laterality: Right;    Family History  Problem Relation Age of Onset  . Cancer Paternal Grandfather    Social History:  reports that he has never smoked. He has never used smokeless tobacco. He reports current alcohol use of about 1.0 standard drink of alcohol per week. He reports that he does not use drugs.  Allergies:  Allergies  Allergen Reactions  . Biaxin [Clarithromycin] Nausea And Vomiting  . Corn-Containing Products Other (See Comments)    headache  . Morphine And Related   . Vicodin [Hydrocodone-Acetaminophen] Nausea And Vomiting  . Codeine Nausea And Vomiting    Medications Prior to Admission  Medication Sig Dispense Refill  . buPROPion (WELLBUTRIN XL) 150 MG 24 hr tablet Take 150 mg by mouth daily.    . cyclobenzaprine (FLEXERIL) 10 MG tablet Take 1 tablet (10 mg total) by mouth 3 (three) times daily as needed for muscle spasms. 30 tablet 0  . Desvenlafaxine Succinate (PRISTIQ PO) Take by  mouth.    . fish oil-omega-3 fatty acids 1000 MG capsule Take 1 g by mouth 2 (two) times daily.     Marland Kitchen ibuprofen (ADVIL) 600 MG tablet Take 1 tablet (600 mg total) by mouth every 6 (six) hours as needed. 30 tablet 0  . Multiple Vitamins-Minerals (MULTIVITAMINS THER. W/MINERALS) TABS Take 1 tablet by mouth daily.     . S-Adenosylmethionine (SAM-E) 400 MG TABS Take 1 tablet by mouth daily.     Marland Kitchen zolpidem (AMBIEN) 5 MG tablet Take 5 mg by mouth at bedtime as needed. Sleep     . OVER THE COUNTER MEDICATION Take 1 tablet by mouth daily. 5-Hydroxytryptophan (5-HTP),      Results for orders placed or performed during the hospital encounter of 08/29/20 (from the past 48 hour(s))  SARS CORONAVIRUS 2 (TAT 6-24 HRS) Nasopharyngeal Nasopharyngeal Swab     Status: None   Collection Time: 08/29/20 12:55 PM   Specimen: Nasopharyngeal Swab  Result Value Ref Range   SARS Coronavirus 2 NEGATIVE NEGATIVE    Comment: (NOTE) SARS-CoV-2 target nucleic acids are NOT DETECTED.  The SARS-CoV-2 RNA is generally detectable in upper and lower respiratory specimens during the acute phase of infection. Negative results do not preclude SARS-CoV-2 infection, do not rule out co-infections with other pathogens, and should not be used as the sole basis for treatment or other patient management decisions. Negative results must be combined with clinical observations, patient history, and epidemiological information.  The expected result is Negative.  Fact Sheet for Patients: SugarRoll.be  Fact Sheet for Healthcare Providers: https://www.woods-mathews.com/  This test is not yet approved or cleared by the [place] FDA and  has been authorized for detection and/or diagnosis of SARS-CoV-2 by FDA under an Emergency Use Authorization (EUA). This EUA will remain  in effect (meaning this test can be used) for the duration of the COVID-19 declaration under Se ction 564(b)(1) of the  Act, 21 U.S.C. section 360bbb-3(b)(1), unless the authorization is terminated or revoked sooner.  Performed at [facility], [place] [address] [place], [place] [address]    No results found.  Review of Systems no recent fever, chills, nausea, vomiting or changes in his appetite Blood pressure 114/65, pulse (!) 58, temperature 98.2 F (36.8 C), resp. rate 11, height 5\' 6"  (1.676 m), weight 69.2 kg, SpO2 100 %. Physical Exam  well-nourished well-developed man in no apparent distress.  Alert and oriented x4.  Normal mood and affect.  Gait is nonweightbearing on the left.  Left ankle has mild swelling.  Skin is otherwise healthy and intact.  Palpable defect at the insertion of the Achilles at the posterior ankle.  Intact sensibility to light touch dorsally and plantarly at the forefoot.  Palpable pulses in the foot.  Active plantar flexion and dorsiflexion strength of the toes.  Assessment/Plan Left Achilles rupture, Haglund deformity and tight heel cord -to the operating room today for left Achilles tendon reconstruction, gastrocnemius recession and excision of the Haglund deformity.  The risks and benefits of the alternative treatment options have been discussed in detail.  The patient wishes to proceed with surgery and specifically understands risks of bleeding, infection, nerve damage, blood clots, need for additional surgery, amputation and death.   Wylene Simmer, MD 09/17/20, 9:55 AM

## 2020-08-31 NOTE — Anesthesia Procedure Notes (Signed)
Anesthesia Regional Block: Popliteal block   Pre-Anesthetic Checklist: ,, timeout performed, Correct Patient, Correct Site, Correct Laterality, Correct Procedure, Correct Position, site marked, Risks and benefits discussed,  Surgical consent,  Pre-op evaluation,  At surgeon's request and post-op pain management  Laterality: Left  Prep: chloraprep       Needles:  Injection technique: Single-shot  Needle Type: Echogenic Stimulator Needle          Additional Needles:   Procedures:, nerve stimulator,,,,,,,   Nerve Stimulator or Paresthesia:  Response: plantar flexion of foot, 0.45 mA,   Additional Responses:   Narrative:  Start time: 08/31/2020 9:39 AM End time: 08/31/2020 9:47 AM Injection made incrementally with aspirations every 5 mL.  Performed by: Personally  Anesthesiologist: Albertha Ghee, MD  Additional Notes: Functioning IV was confirmed and monitors were applied.  A 22mm 21ga Arrow echogenic stimulator needle was used. Sterile prep and drape,hand hygiene and sterile gloves were used.  Negative aspiration and negative test dose prior to incremental administration of local anesthetic. The patient tolerated the procedure well.  Ultrasound guidance: relevent anatomy identified, needle position confirmed, local anesthetic spread visualized around nerve(s), vascular puncture avoided.  Image printed for medical record.

## 2020-08-31 NOTE — Anesthesia Preprocedure Evaluation (Signed)
Anesthesia Evaluation  Patient identified by MRN, date of birth, ID band Patient awake    Reviewed: Allergy & Precautions, H&P , NPO status , Patient's Chart, lab work & pertinent test results  Airway Mallampati: II   Neck ROM: full    Dental   Pulmonary neg pulmonary ROS,    breath sounds clear to auscultation       Cardiovascular negative cardio ROS   Rhythm:regular Rate:Normal     Neuro/Psych PSYCHIATRIC DISORDERS Anxiety Depression    GI/Hepatic   Endo/Other    Renal/GU      Musculoskeletal  (+) Arthritis ,   Abdominal   Peds  Hematology   Anesthesia Other Findings   Reproductive/Obstetrics                             Anesthesia Physical Anesthesia Plan  ASA: II  Anesthesia Plan: General   Post-op Pain Management:  Regional for Post-op pain   Induction: Intravenous  PONV Risk Score and Plan: 2 and Ondansetron, Dexamethasone, Midazolam and Treatment may vary due to age or medical condition  Airway Management Planned: Oral ETT  Additional Equipment:   Intra-op Plan:   Post-operative Plan: Extubation in OR  Informed Consent: I have reviewed the patients History and Physical, chart, labs and discussed the procedure including the risks, benefits and alternatives for the proposed anesthesia with the patient or authorized representative who has indicated his/her understanding and acceptance.       Plan Discussed with: CRNA, Anesthesiologist and Surgeon  Anesthesia Plan Comments:         Anesthesia Quick Evaluation

## 2020-08-31 NOTE — Anesthesia Procedure Notes (Signed)
Anesthesia Regional Block: Adductor canal block   Pre-Anesthetic Checklist: ,, timeout performed, Correct Patient, Correct Site, Correct Laterality, Correct Procedure, Correct Position, site marked, Risks and benefits discussed,  Surgical consent,  Pre-op evaluation,  At surgeon's request and post-op pain management  Laterality: Left  Prep: chloraprep       Needles:  Injection technique: Single-shot  Needle Type: Echogenic Needle     Needle Length: 9cm  Needle Gauge: 21     Additional Needles:   Narrative:  Start time: 08/31/2020 9:46 AM End time: 08/31/2020 9:52 AM Injection made incrementally with aspirations every 5 mL.  Performed by: Personally  Anesthesiologist: Albertha Ghee, MD  Additional Notes: Pt tolerated the procedure well.

## 2020-08-31 NOTE — Op Note (Signed)
08/31/2020  11:30 AM  PATIENT:  Roger Travis  49 y.o. male  PRE-OPERATIVE DIAGNOSIS:  Left Achilles tendon rupture  POST-OPERATIVE DIAGNOSIS:  Left Achilles tendon rupture  Procedure(s): 1.  Left Achilles tendon repair   2.  Excision of left calcaneus Haglund deformity  SURGEON:  Wylene Simmer, MD  ASSISTANT: Mechele Claude, PA-C  ANESTHESIA:   General, regional  EBL:  minimal   TOURNIQUET:   Total Tourniquet Time Documented: Thigh (Right) - 53 minutes Total: Thigh (Right) - 53 minutes  COMPLICATIONS:  None apparent  DISPOSITION:  Extubated, awake and stable to recovery.  INDICATION FOR PROCEDURE: The patient is a 49 year old male without significant past medical history.  He ruptured his left Achilles tendon participating in a obstacle race about 10 days ago.  He presents now for operative treatment of this Achilles tendon rupture.  The risks and benefits of the alternative treatment options have been discussed in detail.  The patient wishes to proceed with surgery and specifically understands risks of bleeding, infection, nerve damage, blood clots, need for additional surgery, amputation and death.  PROCEDURE IN DETAIL: After preoperative consent was obtained and the correct operative site was identified, the patient was brought to the operating room supine on a stretcher.  General anesthesia was induced.  Preoperative antibiotics were administered.  A surgical timeout was taken.  The left lower extremity was exsanguinated and a thigh tourniquet inflated to 250 mmHg.  The patient was then turned in the prone position on the operating table with all bony prominences padded well.  The left lower extremity was prepped and draped in standard sterile fashion.  A longitudinal incision was made over the palpable defect in the tendon.  Dissection was carried down through the subcutaneous tissues and peritenon.  The proximal stump was identified.  It was cleaned of all hematoma and  fibrous tissue.  The distal stump was identified.  This was noted to be a partial avulsion of the Achilles at the calcaneus and partial rupture medially.  The Haglund deformity was exposed and debrided with a rondure leaving a healthy bed of bone.  A double loaded juggernaut suture anchor was then inserted after drilling a tunnel.  This was noted to have excellent purchase in the bone.  The proximal stump was mobilized and traction applied with a traction stitch.  The first limb of suture from the anchor was passed antegrade and then retrograde at the medial half of the proximal stump.  The second limb of suture was passed antegrade and retrograde through the proximal stump at the lateral half.  The remaining 2 limbs of suture were used to pull the tendon stump back down to bone at the insertion point.  The sutures were tied.  The remaining distal stump was repaired to the proximal stump with figure-of-eight sutures of 0 Vicryl.  The ankle was then carefully examined and was noted to have appropriate resting gravity equinus.  The wound was irrigated copiously.  Vancomycin powder was sprinkled in the wound.  The peritenon was repaired with inverted simple sutures of 2-0 Vicryl.  The skin incision was closed with a running 3-0 nylon.  Sterile dressings were applied followed by a well-padded short leg splint with the ankle in maximal plantarflexion.  Tourniquet was released after application of the dressings.  The patient was awakened from anesthesia and transported to the recovery room in stable condition.   FOLLOW UP PLAN: Nonweightbearing on the left lower extremity for a total of 6 weeks postop.  Follow-up in the office in 2 weeks for suture removal and conversion to a cast.  Aspirin for DVT prophylaxis.   Mechele Claude PA-C was present and scrubbed for the duration of the operative case. His assistance was essential in positioning the patient, prepping and draping, gaining and maintaining exposure, performing  the operation, closing and dressing the wounds and applying the splint.

## 2020-08-31 NOTE — Progress Notes (Signed)
Assisted Dr. Marcie Bal with left, ultrasound guided, popliteal and adductor canal blocks. Side rails up, monitors on throughout procedure. See vital signs in flow sheet. Tolerated Procedure well.

## 2020-09-01 ENCOUNTER — Encounter (HOSPITAL_BASED_OUTPATIENT_CLINIC_OR_DEPARTMENT_OTHER): Payer: Self-pay | Admitting: Orthopedic Surgery

## 2021-10-23 ENCOUNTER — Other Ambulatory Visit: Payer: Self-pay

## 2021-10-23 ENCOUNTER — Encounter (HOSPITAL_BASED_OUTPATIENT_CLINIC_OR_DEPARTMENT_OTHER): Payer: Self-pay

## 2021-10-23 ENCOUNTER — Emergency Department (HOSPITAL_BASED_OUTPATIENT_CLINIC_OR_DEPARTMENT_OTHER): Payer: 59

## 2021-10-23 ENCOUNTER — Emergency Department (HOSPITAL_BASED_OUTPATIENT_CLINIC_OR_DEPARTMENT_OTHER)
Admission: EM | Admit: 2021-10-23 | Discharge: 2021-10-24 | Disposition: A | Discharge status: Home / Self Care | Payer: 59 | Attending: Emergency Medicine | Admitting: Emergency Medicine

## 2021-10-23 DIAGNOSIS — R1031 Right lower quadrant pain: Secondary | ICD-10-CM | POA: Diagnosis present

## 2021-10-23 DIAGNOSIS — Z20822 Contact with and (suspected) exposure to covid-19: Secondary | ICD-10-CM | POA: Insufficient documentation

## 2021-10-23 DIAGNOSIS — K3580 Unspecified acute appendicitis: Secondary | ICD-10-CM | POA: Diagnosis not present

## 2021-10-23 LAB — COMPREHENSIVE METABOLIC PANEL
ALT: 40 U/L (ref 0–44)
AST: 39 U/L (ref 15–41)
Albumin: 4.6 g/dL (ref 3.5–5.0)
Alkaline Phosphatase: 56 U/L (ref 38–126)
Anion gap: 12 (ref 5–15)
BUN: 20 mg/dL (ref 6–20)
CO2: 25 mmol/L (ref 22–32)
Calcium: 9.8 mg/dL (ref 8.9–10.3)
Chloride: 100 mmol/L (ref 98–111)
Creatinine, Ser: 1.12 mg/dL (ref 0.61–1.24)
GFR, Estimated: >60 mL/min (ref >60)
Glucose, Bld: 125 mg/dL — ABNORMAL HIGH (ref 70–99)
Potassium: 4.1 mmol/L (ref 3.5–5.1)
Sodium: 137 mmol/L (ref 135–145)
Total Bilirubin: 0.6 mg/dL (ref 0.3–1.2)
Total Protein: 7.8 g/dL (ref 6.5–8.1)

## 2021-10-23 LAB — CBC
HCT: 42.7 % (ref 39.0–52.0)
Hemoglobin: 14.2 g/dL (ref 13.0–17.0)
MCH: 30.7 pg (ref 26.0–34.0)
MCHC: 33.3 g/dL (ref 30.0–36.0)
MCV: 92.2 fL (ref 80.0–100.0)
Platelets: 228 10*3/uL (ref 150–400)
RBC: 4.63 MIL/uL (ref 4.22–5.81)
RDW: 12.6 % (ref 11.5–15.5)
WBC: 15.8 10*3/uL — ABNORMAL HIGH (ref 4.0–10.5)
nRBC: 0 % (ref 0.0–0.2)

## 2021-10-23 LAB — LIPASE, BLOOD: Lipase: 24 U/L (ref 11–51)

## 2021-10-23 MED ORDER — FENTANYL CITRATE PF 50 MCG/ML IJ SOSY
50.0000 ug | PREFILLED_SYRINGE | Freq: Once | INTRAMUSCULAR | Status: AC
  Administered 2021-10-23: 50 ug via INTRAVENOUS
  Filled 2021-10-23: qty 1

## 2021-10-23 MED ORDER — IOHEXOL 300 MG/ML  SOLN
100.0000 mL | Freq: Once | INTRAMUSCULAR | Status: AC | PRN
  Administered 2021-10-23: 100 mL via INTRAVENOUS

## 2021-10-23 MED ORDER — LACTATED RINGERS IV BOLUS
1000.0000 mL | Freq: Once | INTRAVENOUS | Status: AC
  Administered 2021-10-23: 1000 mL via INTRAVENOUS

## 2021-10-23 MED ORDER — PIPERACILLIN-TAZOBACTAM 3.375 G IVPB 30 MIN
3.3750 g | Freq: Once | INTRAVENOUS | Status: AC
  Administered 2021-10-24: 3.375 g via INTRAVENOUS
  Filled 2021-10-23: qty 50

## 2021-10-23 MED ORDER — ONDANSETRON HCL 4 MG/2ML IJ SOLN
4.0000 mg | Freq: Once | INTRAMUSCULAR | Status: AC
  Administered 2021-10-23: 4 mg via INTRAVENOUS
  Filled 2021-10-23: qty 2

## 2021-10-23 NOTE — ED Triage Notes (Signed)
Pt c/o abd pain that started this evening. Pt reports associated N/V/D.

## 2021-10-23 NOTE — ED Provider Notes (Signed)
  Provider Note MRN:  [id number]  Arrival date & time: 10/24/21    ED Course and Medical Decision Making  Assumed care from Dr. Karle Starch at shift change.  Tender right lower quadrant, awaiting CT abdomen.  CT reveals acute appendicitis.  Discussed case with Dr. Kae Heller of general surgery, will transfer to [facility]  .Critical Care Performed by: Maudie Flakes, MD Authorized by: Maudie Flakes, MD   Critical care provider statement:    Critical care time (minutes):  32   Critical care was necessary to treat or prevent imminent or life-threatening deterioration of the following conditions: acute appendicitis.   Critical care was time spent personally by me on the following activities:  Development of treatment plan with patient or surrogate, discussions with consultants, evaluation of patient's response to treatment, examination of patient, ordering and review of laboratory studies, ordering and review of radiographic studies, ordering and performing treatments and interventions, pulse oximetry, re-evaluation of patient's condition and review of old charts   I assumed direction of critical care for this patient from another provider in my specialty: yes     Care discussed with: accepting provider at another facility    Final Clinical Impressions(s) / ED Diagnoses     ICD-10-CM   1. Acute appendicitis, unspecified acute appendicitis type  K35.80       ED Discharge Orders     None       Discharge Instructions   None     Barth Kirks. Sedonia Small, Ong [email or web address]    Maudie Flakes, MD 10/24/21 [phone]

## 2021-10-23 NOTE — ED Provider Notes (Signed)
[name] Provider Note  CSN: [id number] Arrival date & time: 10/23/21 2119    History Chief Complaint  Patient presents with   Abdominal Pain    Roger Travis is a 50 y.o. male with no significant PMH reports onset of severe epigastric discomfort like a tightness around 4pm today, associated with multiple episodes of vomiting. Does not radiate, some diarrhea recently but no fevers. No blood in emesis or stool. No dysuria or hematuria, no flank pain.    Past Medical History:  Diagnosis Date   Anxiety    Arthritis    right hip   Depression    Recurrent upper respiratory infection (URI) 2006 or2007   walking pneumonia    Past Surgical History:  Procedure Laterality Date   EXCISION HAGLUND'S DEFORMITY WITH ACHILLES TENDON REPAIR Left 08/31/2020   Procedure: Left Achilles tendon reconstruction;  Surgeon: Wylene Simmer, MD;  Location: [place];  Service: Orthopedics;  Laterality: Left;   JOINT REPLACEMENT     LIPOMA EXCISION  10/11/2011   Procedure: EXCISION LIPOMA;  Surgeon: Mcarthur Rossetti;  Location: [facility];  Service: Orthopedics;  Laterality: Right;   TOTAL HIP ARTHROPLASTY  10/11/2011   Procedure: TOTAL HIP ARTHROPLASTY ANTERIOR APPROACH;  Surgeon: Mcarthur Rossetti;  Location: [facility];  Service: Orthopedics;  Laterality: Right;    Family History  Problem Relation Age of Onset   Cancer Paternal Grandfather     Social History   Tobacco Use   Smoking status: Never   Smokeless tobacco: Never  Vaping Use   Vaping Use: Never used  Substance Use Topics   Alcohol use: Yes    Alcohol/week: 1.0 standard drink    Types: 1 Glasses of wine per week   Drug use: No     Home Medications Prior to Admission medications   Medication Sig Start Date End Date Taking? Authorizing Provider  aspirin EC 81 MG tablet Take 1 tablet (81 mg total) by mouth 2 (two) times daily. 08/31/20   Corky Sing, PA-C   buPROPion (WELLBUTRIN XL) 150 MG 24 hr tablet Take 150 mg by mouth daily.    [provider]  cyclobenzaprine (FLEXERIL) 10 MG tablet Take 1 tablet (10 mg total) by mouth 3 (three) times daily as needed for muscle spasms. 03/08/20   Chase Picket, MD  Desvenlafaxine Succinate (PRISTIQ PO) Take by mouth.    [provider]  docusate sodium (COLACE) 100 MG capsule Take 1 capsule (100 mg total) by mouth 2 (two) times daily. While taking narcotic pain medicine. 08/31/20   Corky Sing, PA-C  fish oil-omega-3 fatty acids 1000 MG capsule Take 1 g by mouth 2 (two) times daily.     [provider]  ibuprofen (ADVIL) 600 MG tablet Take 1 tablet (600 mg total) by mouth every 6 (six) hours as needed. 03/08/20   Lamptey, Myrene Galas, MD  Multiple Vitamins-Minerals (MULTIVITAMINS THER. W/MINERALS) TABS Take 1 tablet by mouth daily.     [provider]  OVER THE COUNTER MEDICATION Take 1 tablet by mouth daily. 5-Hydroxytryptophan (5-HTP),    [provider]  promethazine (PHENERGAN) 12.5 MG tablet Take 1 tablet (12.5 mg total) by mouth every 6 (six) hours as needed for nausea or vomiting. 08/31/20   Corky Sing, PA-C  S-Adenosylmethionine (SAM-E) 400 MG TABS Take 1 tablet by mouth daily.     [provider]  senna (SENOKOT) 8.6 MG TABS tablet Take 2 tablets (17.2  mg total) by mouth 2 (two) times daily. 08/31/20   Corky Sing, PA-C  zolpidem (AMBIEN) 5 MG tablet Take 5 mg by mouth at bedtime as needed. Sleep     [provider]     Allergies    Biaxin [clarithromycin], Corn-containing products, Morphine and related, Vicodin [hydrocodone-acetaminophen], and Codeine   Review of Systems   Review of Systems A comprehensive review of systems was completed and negative except as noted in HPI.    Physical Exam BP 132/76 (BP Location: Right Arm)   Pulse 77   Temp 98.1 F (36.7 C)   Resp 18   Ht 5\' 6"  (1.676 m)   Wt 70.3 kg    SpO2 100%   BMI 25.02 kg/m   Physical Exam Vitals and nursing note reviewed.  Constitutional:      Appearance: Normal appearance.     Comments: Uncomfortably appearing  HENT:     Head: Normocephalic and atraumatic.     Nose: Nose normal.     Mouth/Throat:     Mouth: Mucous membranes are moist.  Eyes:     Extraocular Movements: Extraocular movements intact.     Conjunctiva/sclera: Conjunctivae normal.  Cardiovascular:     Rate and Rhythm: Normal rate.  Pulmonary:     Effort: Pulmonary effort is normal.     Breath sounds: Normal breath sounds.  Abdominal:     General: Abdomen is flat.     Palpations: Abdomen is soft.     Tenderness: There is abdominal tenderness in the right lower quadrant. There is guarding. Positive signs include McBurney's sign. Negative signs include Murphy's sign.  Musculoskeletal:        General: No swelling. Normal range of motion.     Cervical back: Neck supple.  Skin:    General: Skin is warm and dry.  Neurological:     General: No focal deficit present.     Mental Status: He is alert.  Psychiatric:        Mood and Affect: Mood normal.     ED Results / Procedures / Treatments   Labs (all labs ordered are listed, but only abnormal results are displayed) Labs Reviewed  COMPREHENSIVE METABOLIC PANEL - Abnormal; Notable for the following components:      Result Value   Glucose, Bld 125 (*)    All other components within normal limits  CBC - Abnormal; Notable for the following components:   WBC 15.8 (*)    All other components within normal limits  LIPASE, BLOOD  URINALYSIS, ROUTINE W REFLEX MICROSCOPIC    EKG None   Radiology No results found.  Procedures Procedures  Medications Ordered in the ED Medications  fentaNYL (SUBLIMAZE) injection 50 mcg (50 mcg Intravenous Given 10/23/21 2247)  ondansetron (ZOFRAN) injection 4 mg (4 mg Intravenous Given 10/23/21 2245)  lactated ringers bolus 1,000 mL (1,000 mLs Intravenous New Bag/Given  10/23/21 2258)  iohexol (OMNIPAQUE) 300 MG/ML solution 100 mL (100 mLs Intravenous Contrast Given 10/23/21 2248)     MDM Rules/Calculators/A&P MDM  Patient with epigastric pain and vomiting, has RLQ guarding on exam concerning for acute appendicitis. CBC shows a leukocytosis, CMP, lipase and UA are pending. Will send for CT. Pain and nausea meds for comfort.   ED Course  I have reviewed the triage vital signs and the nursing notes.  Pertinent labs & imaging results that were available during my care of the patient were reviewed by me and considered in my medical decision making (see  chart for details).  Clinical Course as of 10/23/21 2301  Tue Oct 23, 2021  2236 CMP and lipase are normal.  [CS]  2300 Care of the patient signed out to Dr. Sedonia Small at the change of shift pending CT.  [CS]    Clinical Course User Index [CS] Truddie Hidden, MD    Final Clinical Impression(s) / ED Diagnoses Final diagnoses:  None    Rx / DC Orders ED Discharge Orders     None        Truddie Hidden, MD 10/23/21 [phone]

## 2021-10-24 ENCOUNTER — Observation Stay (HOSPITAL_COMMUNITY): Payer: 59 | Admitting: Certified Registered Nurse Anesthetist

## 2021-10-24 ENCOUNTER — Encounter (HOSPITAL_COMMUNITY): Payer: Self-pay

## 2021-10-24 ENCOUNTER — Encounter (HOSPITAL_COMMUNITY)
Admission: EM | Disposition: A | Discharge status: Home / Self Care | Payer: Self-pay | Source: Home / Self Care | Attending: Emergency Medicine

## 2021-10-24 DIAGNOSIS — K3580 Unspecified acute appendicitis: Secondary | ICD-10-CM | POA: Diagnosis not present

## 2021-10-24 DIAGNOSIS — Z20822 Contact with and (suspected) exposure to covid-19: Secondary | ICD-10-CM | POA: Diagnosis not present

## 2021-10-24 DIAGNOSIS — R1031 Right lower quadrant pain: Secondary | ICD-10-CM | POA: Diagnosis present

## 2021-10-24 HISTORY — PX: LAPAROSCOPIC APPENDECTOMY: SHX408

## 2021-10-24 LAB — CBC
HCT: 40.8 % (ref 39.0–52.0)
Hemoglobin: 13.7 g/dL (ref 13.0–17.0)
MCH: 31.4 pg (ref 26.0–34.0)
MCHC: 33.6 g/dL (ref 30.0–36.0)
MCV: 93.4 fL (ref 80.0–100.0)
Platelets: 201 10*3/uL (ref 150–400)
RBC: 4.37 MIL/uL (ref 4.22–5.81)
RDW: 12.2 % (ref 11.5–15.5)
WBC: 15.9 10*3/uL — ABNORMAL HIGH (ref 4.0–10.5)
nRBC: 0 % (ref 0.0–0.2)

## 2021-10-24 LAB — HIV ANTIBODY (ROUTINE TESTING W REFLEX): HIV Screen 4th Generation wRfx: NONREACTIVE

## 2021-10-24 LAB — URINALYSIS, ROUTINE W REFLEX MICROSCOPIC
Bilirubin Urine: NEGATIVE
Glucose, UA: NEGATIVE mg/dL
Hgb urine dipstick: NEGATIVE
Ketones, ur: >80 mg/dL — AB
Leukocytes,Ua: NEGATIVE
Nitrite: NEGATIVE
Protein, ur: NEGATIVE mg/dL
Specific Gravity, Urine: 1.02 (ref 1.005–1.030)
pH: 5.5 (ref 5.0–8.0)

## 2021-10-24 LAB — BASIC METABOLIC PANEL
Anion gap: 11 (ref 5–15)
BUN: 15 mg/dL (ref 6–20)
CO2: 23 mmol/L (ref 22–32)
Calcium: 8.9 mg/dL (ref 8.9–10.3)
Chloride: 101 mmol/L (ref 98–111)
Creatinine, Ser: 1.04 mg/dL (ref 0.61–1.24)
GFR, Estimated: >60 mL/min (ref >60)
Glucose, Bld: 114 mg/dL — ABNORMAL HIGH (ref 70–99)
Potassium: 4.1 mmol/L (ref 3.5–5.1)
Sodium: 135 mmol/L (ref 135–145)

## 2021-10-24 LAB — RESP PANEL BY RT-PCR (FLU A&B, COVID) ARPGX2
Influenza A by PCR: NEGATIVE
Influenza B by PCR: NEGATIVE
SARS Coronavirus 2 by RT PCR: NEGATIVE

## 2021-10-24 SURGERY — APPENDECTOMY, LAPAROSCOPIC
Anesthesia: General | Site: Abdomen

## 2021-10-24 MED ORDER — LIDOCAINE 2% (20 MG/ML) 5 ML SYRINGE
INTRAMUSCULAR | Status: AC
  Filled 2021-10-24: qty 10

## 2021-10-24 MED ORDER — CHLORHEXIDINE GLUCONATE 0.12 % MT SOLN
OROMUCOSAL | Status: AC
  Administered 2021-10-24: 15 mL via OROMUCOSAL
  Filled 2021-10-24: qty 15

## 2021-10-24 MED ORDER — SODIUM CHLORIDE 0.9 % IV SOLN
2.0000 g | INTRAVENOUS | Status: DC
  Administered 2021-10-24: 2 g via INTRAVENOUS
  Filled 2021-10-24: qty 20

## 2021-10-24 MED ORDER — ONDANSETRON 4 MG PO TBDP: 4.0000 mg | ORAL_TABLET | Freq: Four times a day (QID) | ORAL | Status: DC | PRN

## 2021-10-24 MED ORDER — FENTANYL CITRATE (PF) 100 MCG/2ML IJ SOLN
25.0000 ug | INTRAMUSCULAR | Status: DC | PRN
  Administered 2021-10-24 (×2): 50 ug via INTRAVENOUS

## 2021-10-24 MED ORDER — ONDANSETRON HCL 4 MG/2ML IJ SOLN
4.0000 mg | Freq: Four times a day (QID) | INTRAMUSCULAR | Status: AC | PRN
  Administered 2021-10-24: 4 mg via INTRAVENOUS

## 2021-10-24 MED ORDER — PHENYLEPHRINE 40 MCG/ML (10ML) SYRINGE FOR IV PUSH (FOR BLOOD PRESSURE SUPPORT)
PREFILLED_SYRINGE | INTRAVENOUS | Status: AC
  Filled 2021-10-24: qty 10

## 2021-10-24 MED ORDER — ONDANSETRON HCL 4 MG/2ML IJ SOLN
4.0000 mg | Freq: Four times a day (QID) | INTRAMUSCULAR | Status: DC | PRN
  Administered 2021-10-24: 4 mg via INTRAVENOUS
  Filled 2021-10-24: qty 2

## 2021-10-24 MED ORDER — METHOCARBAMOL 500 MG PO TABS: 500.0000 mg | ORAL_TABLET | Freq: Four times a day (QID) | ORAL | Status: DC | PRN

## 2021-10-24 MED ORDER — DEXAMETHASONE SODIUM PHOSPHATE 10 MG/ML IJ SOLN
INTRAMUSCULAR | Status: DC | PRN
  Administered 2021-10-24: 10 mg via INTRAVENOUS

## 2021-10-24 MED ORDER — HYDROMORPHONE HCL 1 MG/ML IJ SOLN
0.5000 mg | INTRAMUSCULAR | Status: DC | PRN
  Administered 2021-10-24: 0.5 mg via INTRAVENOUS
  Filled 2021-10-24: qty 1

## 2021-10-24 MED ORDER — PROPOFOL 10 MG/ML IV BOLUS
INTRAVENOUS | Status: DC | PRN
  Administered 2021-10-24: 200 mg via INTRAVENOUS

## 2021-10-24 MED ORDER — BISACODYL 10 MG RE SUPP: 10.0000 mg | Freq: Every day | RECTAL | Status: DC | PRN

## 2021-10-24 MED ORDER — METRONIDAZOLE 500 MG/100ML IV SOLN
500.0000 mg | Freq: Two times a day (BID) | INTRAVENOUS | Status: DC
  Administered 2021-10-24: 500 mg via INTRAVENOUS
  Filled 2021-10-24: qty 100

## 2021-10-24 MED ORDER — ACETAMINOPHEN 500 MG PO TABS
1000.0000 mg | ORAL_TABLET | Freq: Four times a day (QID) | ORAL | Status: DC
  Administered 2021-10-24 (×2): 1000 mg via ORAL
  Filled 2021-10-24 (×2): qty 2

## 2021-10-24 MED ORDER — OXYCODONE HCL 5 MG PO TABS: 5.0000 mg | ORAL_TABLET | Freq: Four times a day (QID) | ORAL | Status: DC | PRN

## 2021-10-24 MED ORDER — HYDRALAZINE HCL 20 MG/ML IJ SOLN: 10.0000 mg | INTRAMUSCULAR | Status: DC | PRN

## 2021-10-24 MED ORDER — DOCUSATE SODIUM 100 MG PO CAPS: 100.0000 mg | ORAL_CAPSULE | Freq: Two times a day (BID) | ORAL | Status: DC

## 2021-10-24 MED ORDER — MIDAZOLAM HCL 2 MG/2ML IJ SOLN
INTRAMUSCULAR | Status: DC | PRN
  Administered 2021-10-24: 2 mg via INTRAVENOUS

## 2021-10-24 MED ORDER — SODIUM CHLORIDE 0.9 % IV SOLN: INTRAVENOUS | Status: DC

## 2021-10-24 MED ORDER — DEXAMETHASONE SODIUM PHOSPHATE 10 MG/ML IJ SOLN
INTRAMUSCULAR | Status: AC
  Filled 2021-10-24: qty 2

## 2021-10-24 MED ORDER — TRAMADOL HCL 50 MG PO TABS: 100.0000 mg | ORAL_TABLET | Freq: Four times a day (QID) | ORAL | 0 refills | Status: AC | PRN

## 2021-10-24 MED ORDER — KETOROLAC TROMETHAMINE 15 MG/ML IJ SOLN
15.0000 mg | Freq: Four times a day (QID) | INTRAMUSCULAR | Status: DC | PRN
  Administered 2021-10-24: 15 mg via INTRAVENOUS
  Filled 2021-10-24: qty 1

## 2021-10-24 MED ORDER — SUGAMMADEX SODIUM 200 MG/2ML IV SOLN
INTRAVENOUS | Status: DC | PRN
  Administered 2021-10-24: 200 mg via INTRAVENOUS

## 2021-10-24 MED ORDER — LIDOCAINE 2% (20 MG/ML) 5 ML SYRINGE
INTRAMUSCULAR | Status: DC | PRN
  Administered 2021-10-24: 60 mg via INTRAVENOUS

## 2021-10-24 MED ORDER — ENOXAPARIN SODIUM 40 MG/0.4ML IJ SOSY: 40.0000 mg | PREFILLED_SYRINGE | INTRAMUSCULAR | Status: DC

## 2021-10-24 MED ORDER — SIMETHICONE 80 MG PO CHEW: 40.0000 mg | CHEWABLE_TABLET | Freq: Four times a day (QID) | ORAL | Status: DC | PRN

## 2021-10-24 MED ORDER — ONDANSETRON HCL 4 MG/2ML IJ SOLN
INTRAMUSCULAR | Status: AC
  Filled 2021-10-24: qty 4

## 2021-10-24 MED ORDER — FENTANYL CITRATE (PF) 250 MCG/5ML IJ SOLN
INTRAMUSCULAR | Status: DC | PRN
  Administered 2021-10-24 (×2): 25 ug via INTRAVENOUS
  Administered 2021-10-24: 150 ug via INTRAVENOUS

## 2021-10-24 MED ORDER — DIPHENHYDRAMINE HCL 50 MG/ML IJ SOLN: 25.0000 mg | Freq: Four times a day (QID) | INTRAMUSCULAR | Status: DC | PRN

## 2021-10-24 MED ORDER — LACTATED RINGERS IV SOLN: INTRAVENOUS | Status: DC

## 2021-10-24 MED ORDER — MIDAZOLAM HCL 2 MG/2ML IJ SOLN
INTRAMUSCULAR | Status: AC
  Filled 2021-10-24: qty 2

## 2021-10-24 MED ORDER — BUPIVACAINE-EPINEPHRINE 0.25% -1:200000 IJ SOLN
INTRAMUSCULAR | Status: DC | PRN
  Administered 2021-10-24: 5 mL

## 2021-10-24 MED ORDER — METOPROLOL TARTRATE 5 MG/5ML IV SOLN: 5.0000 mg | Freq: Four times a day (QID) | INTRAVENOUS | Status: DC | PRN

## 2021-10-24 MED ORDER — CHLORHEXIDINE GLUCONATE 0.12 % MT SOLN: 15.0000 mL | Freq: Once | OROMUCOSAL | Status: AC

## 2021-10-24 MED ORDER — FENTANYL CITRATE PF 50 MCG/ML IJ SOSY
50.0000 ug | PREFILLED_SYRINGE | Freq: Once | INTRAMUSCULAR | Status: AC
  Administered 2021-10-24: 50 ug via INTRAVENOUS
  Filled 2021-10-24: qty 1

## 2021-10-24 MED ORDER — FENTANYL CITRATE (PF) 250 MCG/5ML IJ SOLN
INTRAMUSCULAR | Status: AC
  Filled 2021-10-24: qty 5

## 2021-10-24 MED ORDER — BUPIVACAINE-EPINEPHRINE (PF) 0.25% -1:200000 IJ SOLN
INTRAMUSCULAR | Status: AC
  Filled 2021-10-24: qty 30

## 2021-10-24 MED ORDER — ROCURONIUM BROMIDE 10 MG/ML (PF) SYRINGE
PREFILLED_SYRINGE | INTRAVENOUS | Status: DC | PRN
  Administered 2021-10-24: 60 mg via INTRAVENOUS

## 2021-10-24 MED ORDER — ORAL CARE MOUTH RINSE: 15.0000 mL | Freq: Once | OROMUCOSAL | Status: AC

## 2021-10-24 MED ORDER — PROPOFOL 10 MG/ML IV BOLUS
INTRAVENOUS | Status: AC
  Filled 2021-10-24: qty 20

## 2021-10-24 MED ORDER — ONDANSETRON HCL 4 MG/2ML IJ SOLN
INTRAMUSCULAR | Status: AC
  Filled 2021-10-24: qty 2

## 2021-10-24 MED ORDER — DIPHENHYDRAMINE HCL 25 MG PO CAPS: 25.0000 mg | ORAL_CAPSULE | Freq: Four times a day (QID) | ORAL | Status: DC | PRN

## 2021-10-24 MED ORDER — TRAMADOL HCL 50 MG PO TABS: 50.0000 mg | ORAL_TABLET | Freq: Four times a day (QID) | ORAL | Status: DC | PRN

## 2021-10-24 MED ORDER — ONDANSETRON HCL 4 MG/2ML IJ SOLN
INTRAMUSCULAR | Status: DC | PRN
  Administered 2021-10-24: 4 mg via INTRAVENOUS

## 2021-10-24 MED ORDER — FENTANYL CITRATE (PF) 100 MCG/2ML IJ SOLN
INTRAMUSCULAR | Status: AC
  Filled 2021-10-24: qty 2

## 2021-10-24 MED ORDER — PHENYLEPHRINE 40 MCG/ML (10ML) SYRINGE FOR IV PUSH (FOR BLOOD PRESSURE SUPPORT)
PREFILLED_SYRINGE | INTRAVENOUS | Status: DC | PRN
  Administered 2021-10-24: 80 ug via INTRAVENOUS

## 2021-10-24 SURGICAL SUPPLY — 48 items
ADH SKN CLS APL DERMABOND .7 (GAUZE/BANDAGES/DRESSINGS) ×1
APL PRP STRL LF DISP 70% ISPRP (MISCELLANEOUS) ×1
APPLIER CLIP 5 13 M/L LIGAMAX5 (MISCELLANEOUS)
APR CLP MED LRG 5 ANG JAW (MISCELLANEOUS)
BAG COUNTER SPONGE SURGICOUNT (BAG) ×2 IMPLANT
BAG SPEC RTRVL 10 TROC 200 (ENDOMECHANICALS) ×1
BAG SPNG CNTER NS LX DISP (BAG) ×1
BAG SURGICOUNT SPONGE COUNTING (BAG) ×1
CANISTER SUCT 3000ML PPV (MISCELLANEOUS) ×3 IMPLANT
CHLORAPREP W/TINT 26 (MISCELLANEOUS) ×3 IMPLANT
CLIP APPLIE 5 13 M/L LIGAMAX5 (MISCELLANEOUS) IMPLANT
CLOSURE WOUND 1/2 X4 (GAUZE/BANDAGES/DRESSINGS) ×1
COVER SURGICAL LIGHT HANDLE (MISCELLANEOUS) ×3 IMPLANT
CUTTER FLEX LINEAR 45M (STAPLE) ×3 IMPLANT
DERMABOND ADVANCED (GAUZE/BANDAGES/DRESSINGS) ×2
DERMABOND ADVANCED .7 DNX12 (GAUZE/BANDAGES/DRESSINGS) ×1 IMPLANT
ELECT REM PT RETURN 9FT ADLT (ELECTROSURGICAL) ×3
ELECTRODE REM PT RTRN 9FT ADLT (ELECTROSURGICAL) ×1 IMPLANT
GLOVE SURG ENC MOIS LTX SZ7 (GLOVE) ×3 IMPLANT
GLOVE SURG UNDER POLY LF SZ7.5 (GLOVE) ×3 IMPLANT
GOWN STRL REUS W/ TWL LRG LVL3 (GOWN DISPOSABLE) ×3 IMPLANT
GOWN STRL REUS W/TWL LRG LVL3 (GOWN DISPOSABLE) ×9
GRASPER SUT TROCAR 14GX15 (MISCELLANEOUS) ×3 IMPLANT
KIT BASIN OR (CUSTOM PROCEDURE TRAY) ×3 IMPLANT
KIT TURNOVER KIT B (KITS) ×3 IMPLANT
NS IRRIG 1000ML POUR BTL (IV SOLUTION) ×3 IMPLANT
PAD ARMBOARD 7.5X6 YLW CONV (MISCELLANEOUS) ×6 IMPLANT
POUCH RETRIEVAL ECOSAC 10 (ENDOMECHANICALS) ×1 IMPLANT
POUCH RETRIEVAL ECOSAC 10MM (ENDOMECHANICALS) ×3
RELOAD 45 VASCULAR/THIN (ENDOMECHANICALS) IMPLANT
RELOAD STAPLE 45 2.5 WHT GRN (ENDOMECHANICALS) IMPLANT
RELOAD STAPLE TA45 3.5 REG BLU (ENDOMECHANICALS) ×3 IMPLANT
SCISSORS LAP 5X35 DISP (ENDOMECHANICALS) IMPLANT
SET IRRIG TUBING LAPAROSCOPIC (IRRIGATION / IRRIGATOR) ×3 IMPLANT
SET TUBE SMOKE EVAC HIGH FLOW (TUBING) ×3 IMPLANT
SHEARS HARMONIC ACE PLUS 36CM (ENDOMECHANICALS) ×3 IMPLANT
SLEEVE ENDOPATH XCEL 5M (ENDOMECHANICALS) ×3 IMPLANT
SPECIMEN JAR SMALL (MISCELLANEOUS) ×3 IMPLANT
STRIP CLOSURE SKIN 1/2X4 (GAUZE/BANDAGES/DRESSINGS) ×2 IMPLANT
SUT MNCRL AB 4-0 PS2 18 (SUTURE) ×3 IMPLANT
SUT VICRYL 0 UR6 27IN ABS (SUTURE) ×3 IMPLANT
TOWEL GREEN STERILE (TOWEL DISPOSABLE) ×3 IMPLANT
TOWEL GREEN STERILE FF (TOWEL DISPOSABLE) ×3 IMPLANT
TRAY FOLEY MTR SLVR 16FR STAT (SET/KITS/TRAYS/PACK) IMPLANT
TRAY LAPAROSCOPIC MC (CUSTOM PROCEDURE TRAY) ×3 IMPLANT
TROCAR XCEL BLUNT TIP 100MML (ENDOMECHANICALS) ×3 IMPLANT
TROCAR XCEL NON-BLD 5MMX100MML (ENDOMECHANICALS) ×3 IMPLANT
WATER STERILE IRR 1000ML POUR (IV SOLUTION) ×1 IMPLANT

## 2021-10-24 NOTE — Anesthesia Procedure Notes (Signed)
Procedure Name: Intubation Date/Time: 10/24/2021 9:58 AM Performed by: Betha Loa, CRNA Pre-anesthesia Checklist: Patient identified, Emergency Drugs available, Suction available and Patient being monitored Patient Re-evaluated:Patient Re-evaluated prior to induction Oxygen Delivery Method: Circle System Utilized Preoxygenation: Pre-oxygenation with 100% oxygen Induction Type: IV induction Ventilation: Mask ventilation without difficulty Laryngoscope Size: Mac and 3 Grade View: Grade I Tube type: Oral Number of attempts: 1 Airway Equipment and Method: Stylet and Oral airway Placement Confirmation: ETT inserted through vocal cords under direct vision, positive ETCO2 and breath sounds checked- equal and bilateral Secured at: 21 cm Tube secured with: Tape Dental Injury: Teeth and Oropharynx as per pre-operative assessment

## 2021-10-24 NOTE — ED Notes (Signed)
Consent and belongings sent with Pt.  Short Stay RN to give belongings to wife when she arrives.

## 2021-10-24 NOTE — Discharge Instructions (Addendum)
[facility] LAPAROSCOPIC SURGERY: POST OP INSTRUCTIONS  Always review your discharge instruction sheet given to you by the facility where your surgery was performed. IF YOU HAVE DISABILITY OR FAMILY LEAVE FORMS, YOU MUST BRING THEM TO THE OFFICE FOR PROCESSING.   DO NOT GIVE THEM TO YOUR DOCTOR.  A prescription for pain medication may be given to you upon discharge.  Take your pain medication as prescribed, if needed.  If narcotic pain medicine is not needed, then you may take acetaminophen (Tylenol), naprosyn (Alleve), or ibuprofen (Advil) as needed. Take your usually prescribed medications unless otherwise directed. If you need a refill on your pain medication, please contact your pharmacy.  They will contact our office to request authorization. Prescriptions will not be filled after 5pm or on week-ends. You should follow a light diet the first few days after arrival home, such as soup and crackers, etc.  Be sure to include lots of fluids daily. Most patients will experience some swelling and bruising in the area of the incisions.  Ice packs will help.  Swelling and bruising can take several days to resolve.  It is common to experience some constipation if taking pain medication after surgery.  Increasing fluid intake and taking a stool softener (such as Colace) will usually help or prevent this problem from occurring.  A mild laxative (Milk of Magnesia or Miralax) should be taken according to package instructions if there are no bowel movements after 48 hours. Unless discharge instructions indicate otherwise, you may remove your bandages 48 hours after surgery, and you may shower at that time.  You may have steri-strips (small skin tapes) in place directly over the incision.  These strips should be left on the skin for 7-10 days.  If your surgeon used skin glue on the incision, you may shower in 24 hours.  The glue will flake off over the next  2-3 weeks.  Any sutures or staples will be removed at the office during your follow-up visit. ACTIVITIES:  You may resume regular (light) daily activities beginning the next day--such as daily self-care, walking, climbing stairs--gradually increasing activities as tolerated.  You may have sexual intercourse when it is comfortable.  Refrain from any heavy lifting or straining until approved by your doctor. You may drive when you are no longer taking prescription pain medication, you can comfortably wear a seatbelt, and you can safely maneuver your car and apply brakes.  You should see your doctor in the office for a follow-up appointment approximately 2-3 weeks after your surgery.  Make sure that you call for this appointment within a day or two after you arrive home to insure a convenient appointment time.  WHEN TO CALL YOUR DOCTOR: Fever over 101.0 Inability to urinate Continued bleeding from incision. Increased pain, redness, or drainage from the incision. Increasing abdominal pain  The clinic staff is available to answer your questions during regular business hours.  Please don't hesitate to call and ask to speak to one of the nurses for clinical concerns.  If you have a medical emergency, go to the nearest emergency room or call 911.  A surgeon from [facility] is always on call at the hospital. [address] [place], [place], [place]  [address] ? [address], [place], [place]   [address] [phone] ? [phone] ? FAX [phone] Web site: [email or web address]

## 2021-10-24 NOTE — Anesthesia Preprocedure Evaluation (Signed)
Anesthesia Evaluation  Patient identified by MRN, date of birth, ID band Patient awake    Reviewed: Allergy & Precautions, H&P , NPO status , Patient's Chart, lab work & pertinent test results  Airway Mallampati: II   Neck ROM: full    Dental   Pulmonary Recent URI ,    breath sounds clear to auscultation       Cardiovascular negative cardio ROS   Rhythm:regular Rate:Normal     Neuro/Psych PSYCHIATRIC DISORDERS Anxiety Depression    GI/Hepatic appendicitis   Endo/Other    Renal/GU      Musculoskeletal  (+) Arthritis ,   Abdominal   Peds  Hematology   Anesthesia Other Findings   Reproductive/Obstetrics                             Anesthesia Physical Anesthesia Plan  ASA: 2  Anesthesia Plan: General   Post-op Pain Management:    Induction: Intravenous  PONV Risk Score and Plan: 2 and Ondansetron, Dexamethasone, Midazolam and Treatment may vary due to age or medical condition  Airway Management Planned: Oral ETT  Additional Equipment:   Intra-op Plan:   Post-operative Plan: Extubation in OR  Informed Consent: I have reviewed the patients History and Physical, chart, labs and discussed the procedure including the risks, benefits and alternatives for the proposed anesthesia with the patient or authorized representative who has indicated his/her understanding and acceptance.     Dental advisory given  Plan Discussed with: CRNA, Anesthesiologist and Surgeon  Anesthesia Plan Comments:         Anesthesia Quick Evaluation

## 2021-10-24 NOTE — Transfer of Care (Signed)
Immediate Anesthesia Transfer of Care Note  Patient: Roger Travis  Procedure(s) Performed: APPENDECTOMY LAPAROSCOPIC (Abdomen)  Patient Location: PACU  Anesthesia Type:General  Level of Consciousness: awake, alert , oriented, patient cooperative and responds to stimulation  Airway & Oxygen Therapy: Patient Spontanous Breathing  Post-op Assessment: Report given to RN, Post -op Vital signs reviewed and stable and Patient moving all extremities X 4  Post vital signs: Reviewed and stable  Last Vitals:  Vitals Value Taken Time  BP 132/81 10/24/21 1044  Temp    Pulse 86 10/24/21 1045  Resp 14 10/24/21 1045  SpO2 100 % 10/24/21 1045  Vitals shown include unvalidated device data.  Last Pain:  Vitals:   10/24/21 0905  TempSrc:   PainSc: 4          Complications: No notable events documented.

## 2021-10-24 NOTE — Op Note (Signed)
Preoperative diagnosis: Acute appendicitis Postoperative diagnosis: Same as above Procedure: Laparoscopic appendectomy Surgeon: Dr. Serita Grammes Anesthesia: General Estimated blood loss: Minimal Specimens: Appendix to pathology Complications: None Drains: None Special count was correct completion Disposition recovery stable condition  Indications: This is a 50 year old male who is otherwise healthy who presents with less than 24 hours of right lower quadrant pain, elevated white blood cell count, right lower quadrant tenderness on exam and a CT scan consistent with acute uncomplicated appendicitis.  We discussed options and elected proceed with appendectomy.  Procedure: After informed consent was obtained the patient was first given antibiotics.  He had SCDs in place.  He was placed on general anesthesia without complication.  He was prepped and draped in the standard sterile surgical fashion.  A surgical timeout was then performed.  I infiltrated Marcaine below his umbilicus.  I made a vertical incision.  I carried this to the fascia.  I incised the fascia sharply and entered the peritoneum bluntly without injury.  I placed a 0 Vicryl pursestring suture through the fascia.  I then inserted a Hassan trocar and insufflated the abdomen to 15 mmHg pressure.  I then inserted 2 additional 5 mm trochars in the lower abdomen under direct vision.  I then was able to view his right lower quadrant.  His terminal ileum had a mild amount of inflammation I remove this.  His appendix had acute suppurative appendicitis but was not perforated.  I then had to use the harmonic scalpel to take down the retroperitoneal attachments.  I then divided the appendiceal mesentery with the harmonic scalpel.  I was able to identify the base of the appendix, cecum, and terminal ileum.  I then divided the appendix at its base with the GIA.  I placed this in a retrieval bag and removed from the abdomen.  There was no evidence of  any injury and this was hemostatic upon completion.  I then remove the [facility] trocar.  I tied my pursestring down.  I placed 2 additional 0 Vicryl sutures with the suture passer device.  The remaining trochars were removed and the abdomen desufflated.  These were closed with 4 Monocryl and glue.  He tolerated this well was extubated transferred to recovery stable.  He will likely be discharged home.  I have discussed with him prior to surgery that he will be referred as he needs a colonoscopy as well.

## 2021-10-24 NOTE — ED Notes (Signed)
Report called to Janett Billow, RN at [facility]

## 2021-10-24 NOTE — Interval H&P Note (Signed)
History and Physical Interval Note:  10/24/2021 8:03 AM I have seen and examined patient, reviewed ct scan. Discussed proceeding with lap appy today MUAD NOGA  has presented today for surgery, with the diagnosis of ACUTE APPENDICITIS.  The various methods of treatment have been discussed with the patient and family. After consideration of risks, benefits and other options for treatment, the patient has consented to  Procedure(s): APPENDECTOMY LAPAROSCOPIC (N/A) as a surgical intervention.  The patient's history has been reviewed, patient examined, no change in status, stable for surgery.  I have reviewed the patient's chart and labs.  Questions were answered to the patient's satisfaction.     Rolm Bookbinder

## 2021-10-24 NOTE — H&P (Addendum)
Surgical Evaluation  Chief Complaint: abdominal pain  HPI: Very pleasant otherwise healthy 50yo man presented to med center [facility] earlier this evening with acute onset abdominal pain with associated n/v/d . The pain was actually in the upper midline and feels like a tight band. No fevers. He was noted on exam to actually have RLQ tenderness and subsequent CT confirms acute appendicitis. He was been transferred to [facility] for further care.   He is a Materials engineer. No previous abdominal surgery.  Allergies  Allergen Reactions   Biaxin [Clarithromycin] Nausea And Vomiting   Corn-Containing Products Other (See Comments)    headache   Morphine And Related    Vicodin [Hydrocodone-Acetaminophen] Nausea And Vomiting   Codeine Nausea And Vomiting    Past Medical History:  Diagnosis Date   Anxiety    Arthritis    right hip   Depression    Recurrent upper respiratory infection (URI) 2006 or2007   walking pneumonia    Past Surgical History:  Procedure Laterality Date   EXCISION HAGLUND'S DEFORMITY WITH ACHILLES TENDON REPAIR Left 08/31/2020   Procedure: Left Achilles tendon reconstruction;  Surgeon: Wylene Simmer, MD;  Location: [facility];  Service: Orthopedics;  Laterality: Left;   JOINT REPLACEMENT     LIPOMA EXCISION  10/11/2011   Procedure: EXCISION LIPOMA;  Surgeon: Mcarthur Rossetti;  Location: [facility];  Service: Orthopedics;  Laterality: Right;   TOTAL HIP ARTHROPLASTY  10/11/2011   Procedure: TOTAL HIP ARTHROPLASTY ANTERIOR APPROACH;  Surgeon: Mcarthur Rossetti;  Location: [facility];  Service: Orthopedics;  Laterality: Right;    Family History  Problem Relation Age of Onset   Cancer Paternal Grandfather     Social History   Socioeconomic History   Marital status: Married    Spouse name: Not on file   Number of children: Not on file   Years of education: Not on file   Highest education level: Not on file  Occupational History   Not on file   Tobacco Use   Smoking status: Never   Smokeless tobacco: Never  Vaping Use   Vaping Use: Never used  Substance and Sexual Activity   Alcohol use: Yes    Alcohol/week: 1.0 standard drink    Types: 1 Glasses of wine per week   Drug use: No   Sexual activity: Not on file  Other Topics Concern   Not on file  Social History Narrative   Not on file   Social Determinants of Health   Financial Resource Strain: Not on file  Food Insecurity: Not on file  Transportation Needs: Not on file  Physical Activity: Not on file  Stress: Not on file  Social Connections: Not on file    No current facility-administered medications on file prior to encounter.   Current Outpatient Medications on File Prior to Encounter  Medication Sig Dispense Refill   aspirin EC 81 MG tablet Take 1 tablet (81 mg total) by mouth 2 (two) times daily. 28 tablet 0   buPROPion (WELLBUTRIN XL) 150 MG 24 hr tablet Take 150 mg by mouth daily.     cyclobenzaprine (FLEXERIL) 10 MG tablet Take 1 tablet (10 mg total) by mouth 3 (three) times daily as needed for muscle spasms. 30 tablet 0   Desvenlafaxine Succinate (PRISTIQ PO) Take by mouth.     docusate sodium (COLACE) 100 MG capsule Take 1 capsule (100 mg total) by mouth 2 (two) times daily. While taking narcotic pain medicine. 30 capsule 0  fish oil-omega-3 fatty acids 1000 MG capsule Take 1 g by mouth 2 (two) times daily.      ibuprofen (ADVIL) 600 MG tablet Take 1 tablet (600 mg total) by mouth every 6 (six) hours as needed. 30 tablet 0   Multiple Vitamins-Minerals (MULTIVITAMINS THER. W/MINERALS) TABS Take 1 tablet by mouth daily.      OVER THE COUNTER MEDICATION Take 1 tablet by mouth daily. 5-Hydroxytryptophan (5-HTP),     promethazine (PHENERGAN) 12.5 MG tablet Take 1 tablet (12.5 mg total) by mouth every 6 (six) hours as needed for nausea or vomiting. 30 tablet 0   S-Adenosylmethionine (SAM-E) 400 MG TABS Take 1 tablet by mouth daily.      senna (SENOKOT) 8.6 MG  TABS tablet Take 2 tablets (17.2 mg total) by mouth 2 (two) times daily. 30 tablet 0   zolpidem (AMBIEN) 5 MG tablet Take 5 mg by mouth at bedtime as needed. Sleep       Review of Systems: a complete, 10pt review of systems was completed with pertinent positives and negatives as documented in the HPI  Physical Exam: Vitals:   10/24/21 0147 10/24/21 0150  BP:  127/76  Pulse:  60  Resp:  20  Temp: 98.6 F (37 C) 98.6 F (37 C)  SpO2:  100%   Gen: A&Ox3, no distress  Eyes: lids and conjunctivae normal, no icterus. Pupils equally round and reactive to light.  Neck: supple without mass or thyromegaly Chest: respiratory effort is normal. No crepitus or tenderness on palpation of the chest. Breath sounds equal.  Cardiovascular: RRR with palpable distal pulses, no pedal edema Gastrointestinal: soft, nondistended, mildly tender in epigastrium and periumbilical fields, exquisitely tender in RLQ with voluntary guarding Lymphatic: no lymphadenopathy in the neck or groin Muscoloskeletal: no clubbing or cyanosis of the fingers.  Strength is symmetrical throughout.  Range of motion of bilateral upper and lower extremities normal without pain, crepitation or contracture. Neuro: cranial nerves grossly intact.  Sensation intact to light touch diffusely. Psych: appropriate mood and affect, normal insight/judgment intact  Skin: warm and dry   CBC Latest Ref Rng & Units 10/23/2021 04/12/2012 10/13/2011  WBC 4.0 - 10.5 K/uL 15.8(H) 8.2 8.2  Hemoglobin 13.0 - 17.0 g/dL 14.2 13.6(A) 10.5(L)  Hematocrit 39.0 - 52.0 % 42.7 41.3(A) 30.8(L)  Platelets 150 - 400 K/uL 228 - 144(L)    CMP Latest Ref Rng & Units 10/23/2021 04/12/2012 10/12/2011  Glucose 70 - 99 mg/dL 125(H) 87 121(H)  BUN 6 - 20 mg/dL 20 21 9   Creatinine 0.61 - 1.24 mg/dL 1.12 1.16 0.99  Sodium 135 - 145 mmol/L 137 139 136  Potassium 3.5 - 5.1 mmol/L 4.1 4.5 3.5  Chloride 98 - 111 mmol/L 100 102 99  CO2 22 - 32 mmol/L 25 29 31   Calcium 8.9  - 10.3 mg/dL 9.8 9.3 8.2(L)  Total Protein 6.5 - 8.1 g/dL 7.8 7.4 -  Total Bilirubin 0.3 - 1.2 mg/dL 0.6 0.7 -  Alkaline Phos 38 - 126 U/L 56 69 -  AST 15 - 41 U/L 39 47(H) -  ALT 0 - 44 U/L 40 47 -    Lab Results  Component Value Date   INR 1.14 10/01/2011    Imaging: CT Abdomen Pelvis W Contrast  Result Date: 10/23/2021 CLINICAL DATA:  Right lower quadrant abdominal pain. EXAM: CT ABDOMEN AND PELVIS WITH CONTRAST TECHNIQUE: Multidetector CT imaging of the abdomen and pelvis was performed using the standard protocol following bolus administration of intravenous  contrast. CONTRAST:  131mL OMNIPAQUE IOHEXOL 300 MG/ML  SOLN COMPARISON:  None. FINDINGS: Lower chest: No acute abnormality. Hepatobiliary: There is a rounded hypodensity in the liver image 2/9 which is too small to characterize, likely a cyst or hemangioma. The liver is otherwise within normal limits. Gallbladder and bile ducts are within normal limits. Pancreas: Unremarkable. No pancreatic ductal dilatation or surrounding inflammatory changes. Spleen: Normal in size without focal abnormality. Adrenals/Urinary Tract: Rounded cortical hypodensity in the left kidney likely represents a small cyst. The kidneys, adrenal glands and bladder are otherwise within normal limits. Stomach/Bowel: The appendix is dilated measuring 1 cm. Appendicoliths are present. There is trace surrounding inflammatory stranding. Otherwise, there are no dilated bowel loops. There is no free air, pneumatosis or abscess formation. Stomach is within normal limits. Vascular/Lymphatic: No significant vascular findings are present. No enlarged abdominal or pelvic lymph nodes. Reproductive: Prostate is unremarkable. Other: No abdominal wall hernia or abnormality. No abdominopelvic ascites. Musculoskeletal: Right hip arthroplasty is present. IMPRESSION: 1. Findings compatible with acute uncomplicated appendicitis. No evidence for perforation or abscess. Electronically Signed    By: Ronney Asters M.D.   On: 10/23/2021 23:05     A/P: Acute appendicitis. I recommend proceeding with laparoscopic appendectomy. We discussed the surgery including risks of bleeding, infection, pain, scarring, injury to intra-abdominal structures, conversion to open surgery or more extensive resection, risk of staple line leak or delayed abscess, failure to resolve symptoms, postoperative ileus, incisional hernia, as well as general risks of DVT/PE, pneumonia, stroke, heart attack, death. Questions were welcomed and answered to the patient's satisfaction. Will plan for OR later this morning with Dr. Donne Hazel. Possible same-day discharge pending intra- and post-operative course. If remaining admitted postop will need home meds reconciled/ordered    Patient Active Problem List   Diagnosis Date Noted   Acute appendicitis 10/24/2021   Strep throat 05/27/2015   Hip arthritis 10/11/2011       Romana Juniper, MD [facility]  See AMION to contact appropriate on-call provider

## 2021-10-24 NOTE — ED Notes (Signed)
Report given to [facility] with [facility]

## 2021-10-24 NOTE — ED Notes (Signed)
Consent at bedside.  

## 2021-10-25 ENCOUNTER — Encounter (HOSPITAL_COMMUNITY): Payer: Self-pay | Admitting: General Surgery

## 2021-10-25 LAB — SURGICAL PATHOLOGY

## 2021-10-25 NOTE — Anesthesia Postprocedure Evaluation (Signed)
Anesthesia Post Note  Patient: Roger Travis  Procedure(s) Performed: APPENDECTOMY LAPAROSCOPIC (Abdomen)     Patient location during evaluation: PACU Anesthesia Type: General Level of consciousness: awake and alert Pain management: pain level controlled Vital Signs Assessment: post-procedure vital signs reviewed and stable Respiratory status: spontaneous breathing, nonlabored ventilation, respiratory function stable and patient connected to nasal cannula oxygen Cardiovascular status: blood pressure returned to baseline and stable Postop Assessment: no apparent nausea or vomiting Anesthetic complications: no   No notable events documented.  Last Vitals:  Vitals:   10/24/21 1059 10/24/21 1114  BP: (!) 151/74 127/84  Pulse: (!) 57 82  Resp: (!) 8 18  Temp:  36.5 C  SpO2: 99% 100%    Last Pain:  Vitals:   10/24/21 1114  TempSrc:   PainSc: 0-No pain                 Steffen Hase S

## 2023-03-25 IMAGING — CT CT ABD-PELV W/ CM
2 of 5 series · 16 of 46 positions shown, 18 images · IV contrast (APPLIED)
Comparison: None.

CLINICAL DATA: Right lower quadrant abdominal pain.

EXAM:
CT ABDOMEN AND PELVIS WITH CONTRAST
TECHNIQUE: Multidetector CT imaging of the abdomen and pelvis was performed
using the standard protocol following bolus administration of
intravenous contrast.
CONTRAST:  100mL OMNIPAQUE IOHEXOL 300 MG/ML  SOLN

[Series 2: abd pel w · axial · 0.71mm/px · z∈[+904,+1254]mm · 13 of 80 slices shown, 15 images]
[im 5/80  soft-tissue]
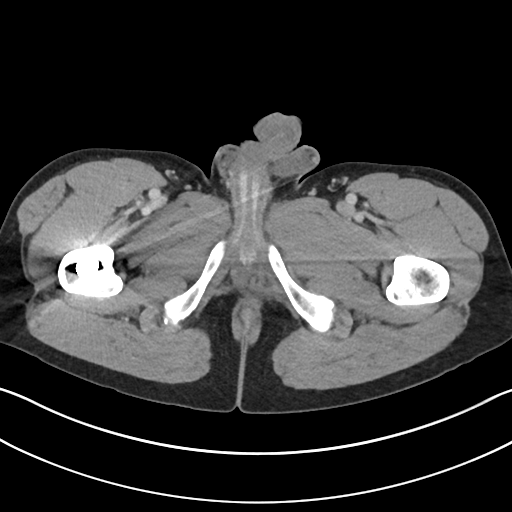
[im 5/80  bone]
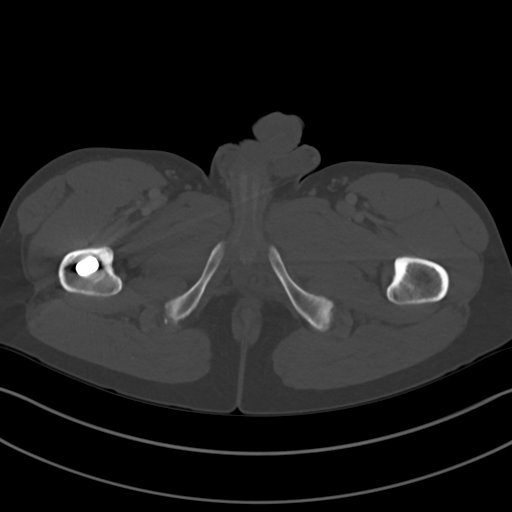
[im 9/80  soft-tissue]
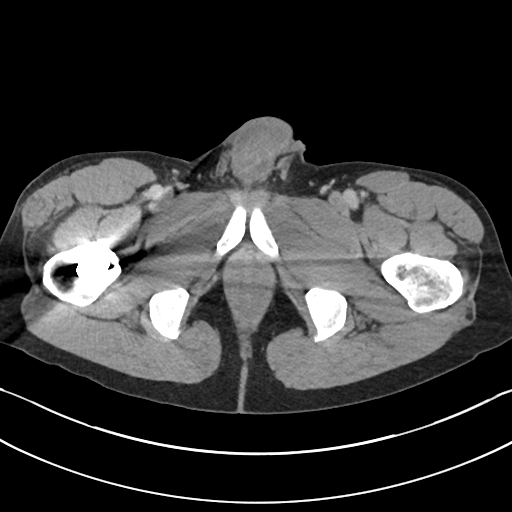
[im 18/80  soft-tissue]
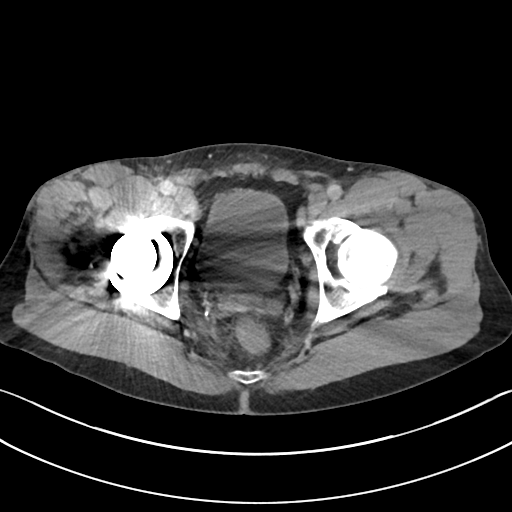
[im 22/80  soft-tissue]
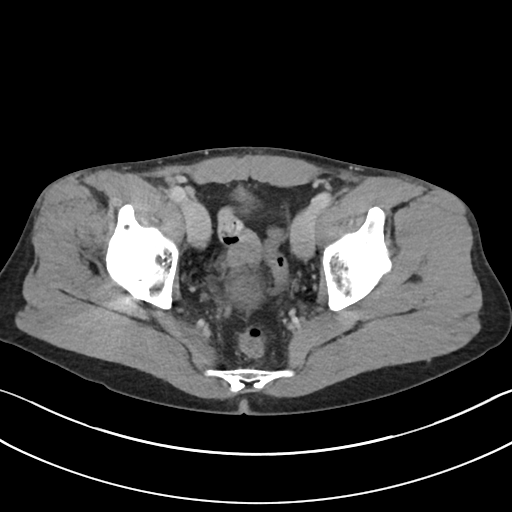
[im 27/80  soft-tissue]
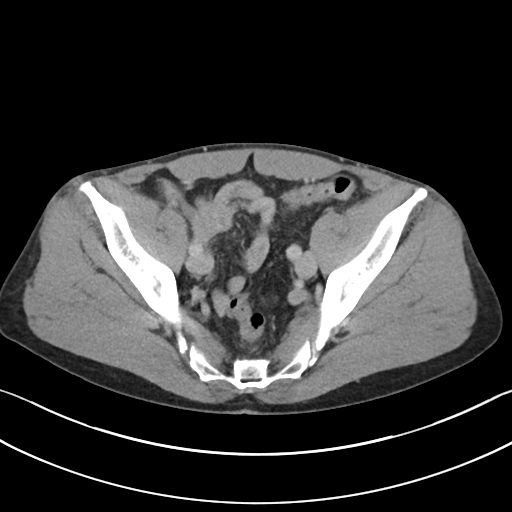
[im 36/80  soft-tissue]
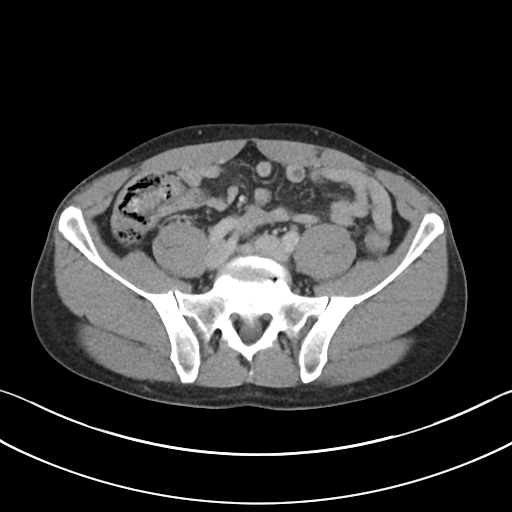
[im 40/80  soft-tissue]
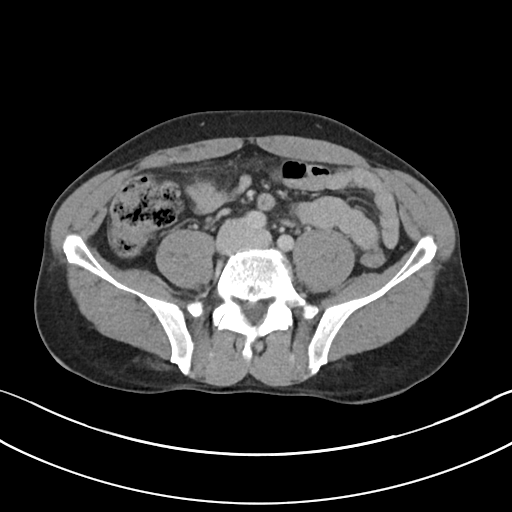
[im 44/80  soft-tissue]
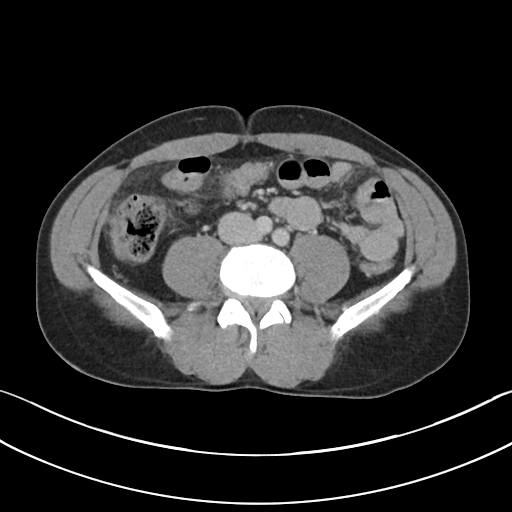
[im 53/80  soft-tissue]
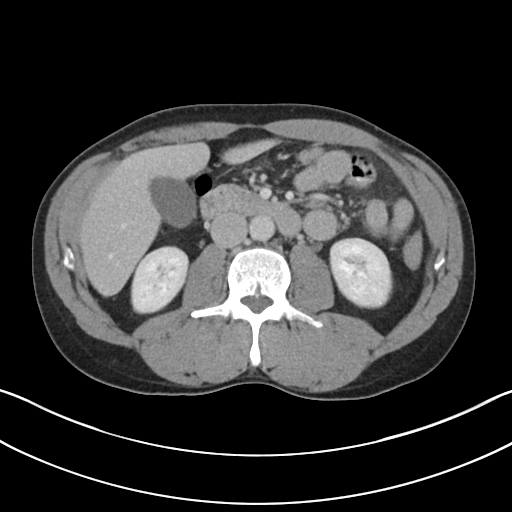
[im 53/80  bone]
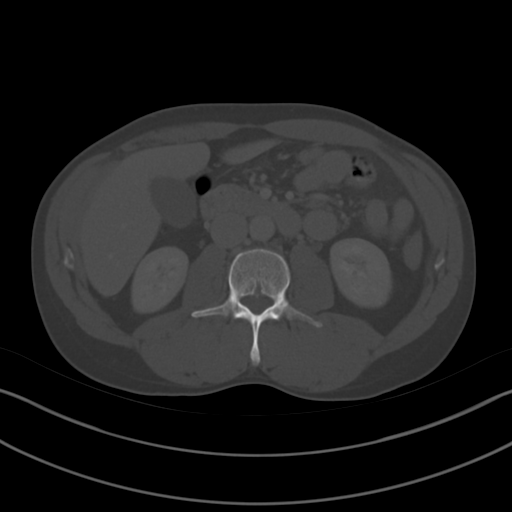
[im 58/80  soft-tissue]
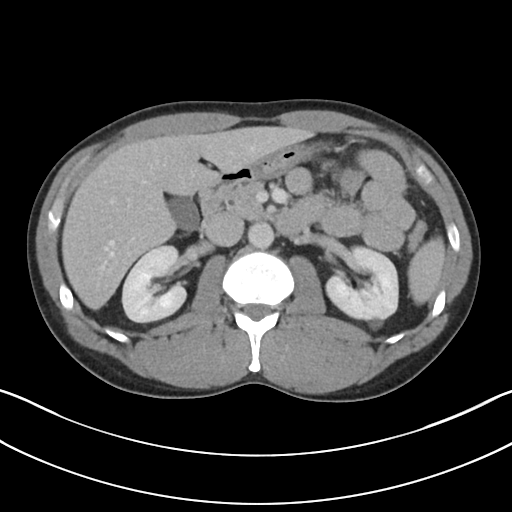
[im 62/80  soft-tissue]
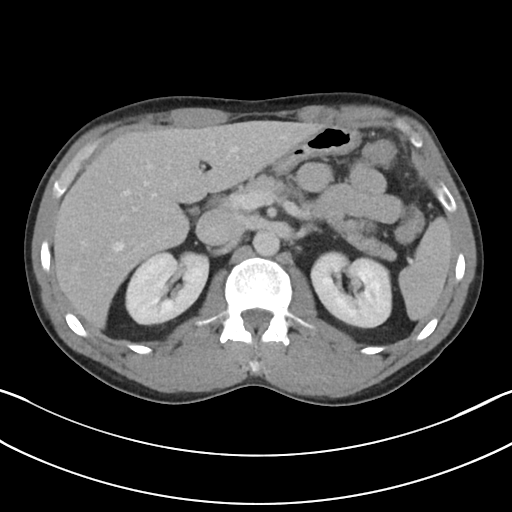
[im 71/80  soft-tissue]
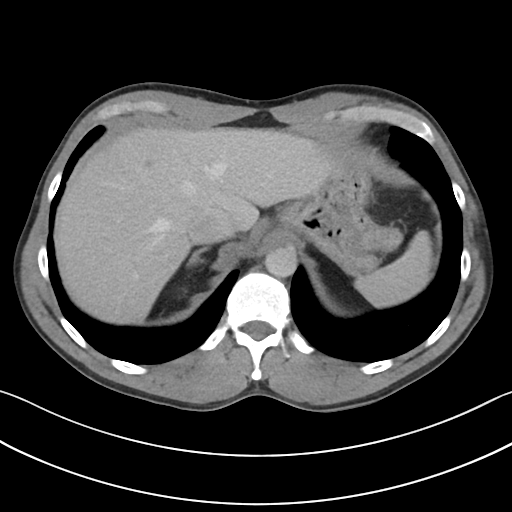
[im 75/80  soft-tissue]
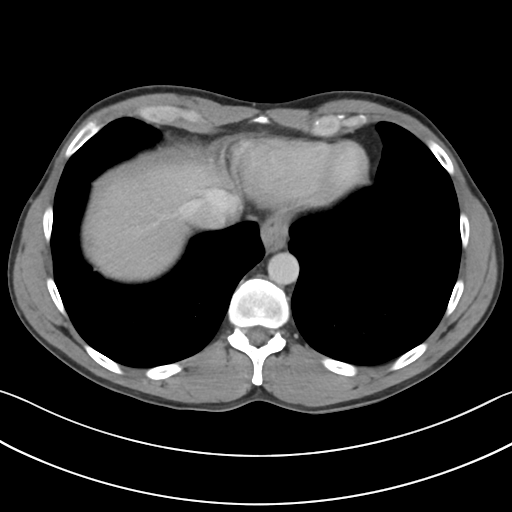

[Series 5: coronal · coronal · 0.72mm/px · 3 of 81 slices shown]
[im 27/81  soft-tissue]
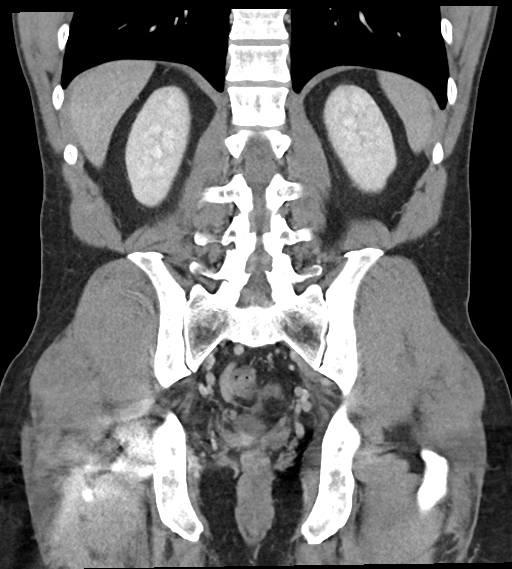
[im 36/81  soft-tissue]
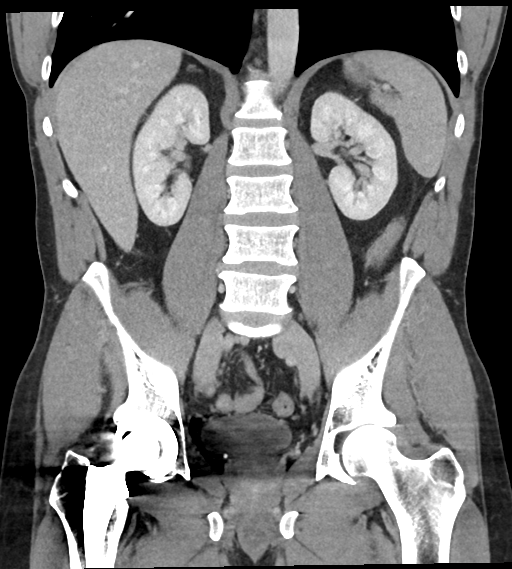
[im 45/81  soft-tissue]
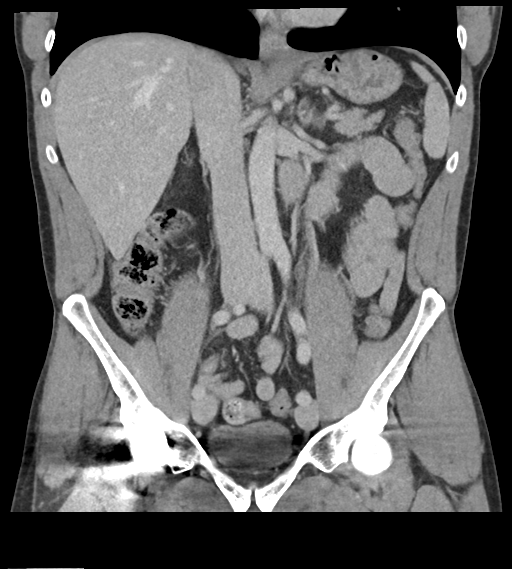

[16 of 46 positions shown; findings below may reference images not displayed]

FINDINGS: Lower chest: No acute abnormality.

Hepatobiliary: There is a rounded hypodensity in the liver image [DATE]
which is too small to characterize, likely a cyst or hemangioma. The
liver is otherwise within normal limits. Gallbladder and bile ducts
are within normal limits.

Pancreas: Unremarkable. No pancreatic ductal dilatation or
surrounding inflammatory changes.

Spleen: Normal in size without focal abnormality.

Adrenals/Urinary Tract: Rounded cortical hypodensity in the left
kidney likely represents a small cyst. The kidneys, adrenal glands
and bladder are otherwise within normal limits.

Stomach/Bowel: The appendix is dilated measuring 1 cm.
Appendicoliths are present. There is trace surrounding inflammatory
stranding. Otherwise, there are no dilated bowel loops. There is no
free air, pneumatosis or abscess formation. Stomach is within normal
limits.

Vascular/Lymphatic: No significant vascular findings are present. No
enlarged abdominal or pelvic lymph nodes.

Reproductive: Prostate is unremarkable.

Other: No abdominal wall hernia or abnormality. No abdominopelvic
ascites.

Musculoskeletal: Right hip arthroplasty is present.
IMPRESSION: 1. Findings compatible with acute uncomplicated appendicitis. No
evidence for perforation or abscess.

## 2023-12-10 ENCOUNTER — Other Ambulatory Visit: Payer: Self-pay

## 2023-12-10 MED ORDER — LISDEXAMFETAMINE DIMESYLATE 60 MG PO CAPS
60.0000 mg | ORAL_CAPSULE | Freq: Every day | ORAL | 0 refills | Status: DC
  Filled 2023-12-10: qty 30, 30d supply, fill #0

## 2024-01-12 ENCOUNTER — Other Ambulatory Visit: Payer: Self-pay

## 2024-01-12 MED ORDER — AMOXICILLIN 500 MG PO CAPS
500.0000 mg | ORAL_CAPSULE | Freq: Three times a day (TID) | ORAL | 0 refills | Status: AC
  Filled 2024-01-12: qty 21, 7d supply, fill #0

## 2024-01-13 ENCOUNTER — Other Ambulatory Visit: Payer: Self-pay

## 2024-01-13 MED ORDER — LISDEXAMFETAMINE DIMESYLATE 60 MG PO CAPS
60.0000 mg | ORAL_CAPSULE | Freq: Every day | ORAL | 0 refills | Status: DC
  Filled 2024-01-13: qty 30, 30d supply, fill #0

## 2024-01-14 ENCOUNTER — Other Ambulatory Visit: Payer: Self-pay

## 2024-01-19 ENCOUNTER — Other Ambulatory Visit: Payer: Self-pay | Admitting: Orthopedic Surgery

## 2024-01-19 DIAGNOSIS — M50123 Cervical disc disorder at C6-C7 level with radiculopathy: Secondary | ICD-10-CM

## 2024-01-19 DIAGNOSIS — M5412 Radiculopathy, cervical region: Secondary | ICD-10-CM

## 2024-01-21 ENCOUNTER — Encounter: Payer: Self-pay | Admitting: Orthopedic Surgery

## 2024-01-23 ENCOUNTER — Ambulatory Visit
Admission: RE | Admit: 2024-01-23 | Discharge: 2024-01-23 | Disposition: A | Discharge status: Home / Self Care | Source: Ambulatory Visit | Attending: Orthopedic Surgery | Admitting: Orthopedic Surgery

## 2024-01-23 DIAGNOSIS — M5412 Radiculopathy, cervical region: Secondary | ICD-10-CM

## 2024-01-23 DIAGNOSIS — M50123 Cervical disc disorder at C6-C7 level with radiculopathy: Secondary | ICD-10-CM

## 2024-02-06 ENCOUNTER — Other Ambulatory Visit: Payer: Self-pay

## 2024-02-06 MED ORDER — ZOLPIDEM TARTRATE 5 MG PO TABS
5.0000 mg | ORAL_TABLET | Freq: Every evening | ORAL | 0 refills | Status: DC
  Filled 2024-02-06: qty 30, 30d supply, fill #0

## 2024-02-06 MED ORDER — LISDEXAMFETAMINE DIMESYLATE 60 MG PO CAPS
60.0000 mg | ORAL_CAPSULE | Freq: Every day | ORAL | 0 refills | Status: DC
  Filled 2024-02-06: qty 30, 30d supply, fill #0

## 2024-02-09 ENCOUNTER — Other Ambulatory Visit: Payer: Self-pay

## 2024-03-08 ENCOUNTER — Other Ambulatory Visit: Payer: Self-pay

## 2024-03-08 MED ORDER — ZOLPIDEM TARTRATE 5 MG PO TABS
5.0000 mg | ORAL_TABLET | Freq: Every day | ORAL | 0 refills | Status: DC
  Filled 2024-03-08: qty 30, 30d supply, fill #0

## 2024-03-08 MED ORDER — LISDEXAMFETAMINE DIMESYLATE 60 MG PO CAPS
60.0000 mg | ORAL_CAPSULE | Freq: Every day | ORAL | 0 refills | Status: DC
  Filled 2024-03-08: qty 30, 30d supply, fill #0

## 2024-03-09 ENCOUNTER — Other Ambulatory Visit: Payer: Self-pay

## 2024-04-05 ENCOUNTER — Other Ambulatory Visit: Payer: Self-pay

## 2024-04-05 MED ORDER — LISDEXAMFETAMINE DIMESYLATE 60 MG PO CAPS
60.0000 mg | ORAL_CAPSULE | Freq: Every day | ORAL | 0 refills | Status: DC
  Filled 2024-04-05: qty 30, 30d supply, fill #0

## 2024-04-05 MED ORDER — ZOLPIDEM TARTRATE 5 MG PO TABS
5.0000 mg | ORAL_TABLET | Freq: Every evening | ORAL | 0 refills | Status: DC
  Filled 2024-04-05: qty 30, 30d supply, fill #0

## 2024-04-06 ENCOUNTER — Other Ambulatory Visit: Payer: Self-pay

## 2024-04-21 ENCOUNTER — Other Ambulatory Visit: Payer: Self-pay

## 2024-04-21 MED ORDER — GUANFACINE HCL 1 MG PO TABS
1.0000 mg | ORAL_TABLET | Freq: Every day | ORAL | 3 refills | Status: AC
  Filled 2024-04-21: qty 30, 30d supply, fill #0
  Filled 2024-05-25: qty 30, 30d supply, fill #1
  Filled 2024-06-26: qty 30, 30d supply, fill #2

## 2024-04-23 ENCOUNTER — Other Ambulatory Visit: Payer: Self-pay

## 2024-04-23 MED ORDER — GABAPENTIN 300 MG PO CAPS
300.0000 mg | ORAL_CAPSULE | Freq: Every evening | ORAL | 2 refills | Status: AC
  Filled 2024-04-23: qty 30, 30d supply, fill #0
  Filled 2024-05-25: qty 30, 30d supply, fill #1
  Filled 2024-06-26: qty 30, 30d supply, fill #2

## 2024-04-26 ENCOUNTER — Other Ambulatory Visit: Payer: Self-pay

## 2024-05-06 ENCOUNTER — Other Ambulatory Visit: Payer: Self-pay

## 2024-05-06 MED ORDER — LISDEXAMFETAMINE DIMESYLATE 60 MG PO CAPS
60.0000 mg | ORAL_CAPSULE | Freq: Every day | ORAL | 0 refills | Status: DC
  Filled 2024-05-06: qty 30, 30d supply, fill #0

## 2024-05-06 MED ORDER — ZOLPIDEM TARTRATE 5 MG PO TABS
5.0000 mg | ORAL_TABLET | Freq: Every evening | ORAL | 0 refills | Status: DC
  Filled 2024-05-06: qty 30, 30d supply, fill #0

## 2024-05-07 ENCOUNTER — Other Ambulatory Visit: Payer: Self-pay

## 2024-05-11 ENCOUNTER — Other Ambulatory Visit: Payer: Self-pay

## 2024-05-11 MED ORDER — MELOXICAM 15 MG PO TABS
15.0000 mg | ORAL_TABLET | Freq: Every day | ORAL | 2 refills | Status: AC
  Filled 2024-05-11: qty 30, 30d supply, fill #0
  Filled 2024-06-05: qty 30, 30d supply, fill #1
  Filled 2024-06-26 – 2024-09-07 (×2): qty 30, 30d supply, fill #2

## 2024-05-25 ENCOUNTER — Other Ambulatory Visit: Payer: Self-pay

## 2024-05-26 ENCOUNTER — Other Ambulatory Visit: Payer: Self-pay

## 2024-06-07 ENCOUNTER — Other Ambulatory Visit: Payer: Self-pay

## 2024-06-07 MED ORDER — ZOLPIDEM TARTRATE 5 MG PO TABS
5.0000 mg | ORAL_TABLET | Freq: Every evening | ORAL | 0 refills | Status: DC
  Filled 2024-06-07: qty 30, 30d supply, fill #0

## 2024-06-07 MED ORDER — LISDEXAMFETAMINE DIMESYLATE 60 MG PO CAPS
60.0000 mg | ORAL_CAPSULE | Freq: Every day | ORAL | 0 refills | Status: DC
  Filled 2024-06-07: qty 30, 30d supply, fill #0

## 2024-06-07 MED ORDER — BUPROPION HCL ER (XL) 150 MG PO TB24
150.0000 mg | ORAL_TABLET | Freq: Every day | ORAL | 5 refills | Status: AC
  Filled 2024-06-07: qty 30, 30d supply, fill #0
  Filled 2024-06-26 – 2024-07-23 (×2): qty 30, 30d supply, fill #1
  Filled 2024-09-07: qty 30, 30d supply, fill #2
  Filled 2024-11-02: qty 30, 30d supply, fill #3
  Filled 2024-11-30: qty 30, 30d supply, fill #4

## 2024-06-26 ENCOUNTER — Other Ambulatory Visit: Payer: Self-pay

## 2024-06-28 ENCOUNTER — Other Ambulatory Visit: Payer: Self-pay

## 2024-06-29 ENCOUNTER — Other Ambulatory Visit: Payer: Self-pay

## 2024-06-29 MED ORDER — TRAMADOL HCL 50 MG PO TABS
50.0000 mg | ORAL_TABLET | Freq: Four times a day (QID) | ORAL | 0 refills | Status: AC | PRN
  Filled 2024-06-29: qty 8, 2d supply, fill #0

## 2024-07-05 ENCOUNTER — Other Ambulatory Visit: Payer: Self-pay

## 2024-07-05 MED ORDER — ZOLPIDEM TARTRATE 5 MG PO TABS
5.0000 mg | ORAL_TABLET | Freq: Every evening | ORAL | 0 refills | Status: AC
  Filled 2024-07-05: qty 30, 30d supply, fill #0

## 2024-07-05 MED ORDER — LISDEXAMFETAMINE DIMESYLATE 60 MG PO CAPS
60.0000 mg | ORAL_CAPSULE | Freq: Every day | ORAL | 0 refills | Status: AC
  Filled 2024-07-05: qty 30, 30d supply, fill #0

## 2024-07-07 ENCOUNTER — Other Ambulatory Visit: Payer: Self-pay

## 2024-07-07 MED ORDER — ZOLPIDEM TARTRATE 5 MG PO TABS
5.0000 mg | ORAL_TABLET | Freq: Every evening | ORAL | 0 refills | Status: AC
  Filled 2024-07-23: qty 30, 30d supply, fill #0

## 2024-07-07 MED ORDER — VYVANSE 60 MG PO CAPS
60.0000 mg | ORAL_CAPSULE | Freq: Every morning | ORAL | 0 refills | Status: DC
  Filled 2024-07-08: qty 5, 5d supply, fill #0

## 2024-07-08 ENCOUNTER — Other Ambulatory Visit: Payer: Self-pay

## 2024-07-09 ENCOUNTER — Other Ambulatory Visit: Payer: Self-pay

## 2024-07-12 ENCOUNTER — Other Ambulatory Visit: Payer: Self-pay

## 2024-07-12 MED ORDER — DOXYCYCLINE HYCLATE 100 MG PO CAPS
100.0000 mg | ORAL_CAPSULE | Freq: Two times a day (BID) | ORAL | 0 refills | Status: AC
  Filled 2024-07-12: qty 10, 5d supply, fill #0

## 2024-07-12 MED ORDER — AMOXICILLIN-POT CLAVULANATE 875-125 MG PO TABS
1.0000 | ORAL_TABLET | Freq: Two times a day (BID) | ORAL | 0 refills | Status: AC
  Filled 2024-07-12: qty 10, 5d supply, fill #0

## 2024-07-13 ENCOUNTER — Other Ambulatory Visit: Payer: Self-pay

## 2024-07-13 MED ORDER — METHYLPHENIDATE HCL ER (CD) 60 MG PO CPCR
60.0000 mg | ORAL_CAPSULE | Freq: Every morning | ORAL | 0 refills | Status: AC
  Filled 2024-07-13: qty 30, 30d supply, fill #0

## 2024-07-14 ENCOUNTER — Other Ambulatory Visit: Payer: Self-pay

## 2024-07-23 ENCOUNTER — Other Ambulatory Visit: Payer: Self-pay

## 2024-07-27 ENCOUNTER — Other Ambulatory Visit: Payer: Self-pay

## 2024-08-04 ENCOUNTER — Other Ambulatory Visit: Payer: Self-pay

## 2024-08-04 MED ORDER — GUANFACINE HCL ER 2 MG PO TB24: 2.0000 mg | ORAL_TABLET | ORAL | 6 refills | Status: DC

## 2024-08-04 MED ORDER — GUANFACINE HCL ER 2 MG PO TB24
2.0000 mg | ORAL_TABLET | Freq: Every day | ORAL | 6 refills | Status: AC
  Filled 2024-08-04: qty 30, 30d supply, fill #0
  Filled 2024-09-07 – 2024-10-05 (×2): qty 30, 30d supply, fill #1

## 2024-08-04 MED ORDER — ZOLPIDEM TARTRATE 5 MG PO TABS
5.0000 mg | ORAL_TABLET | Freq: Every day | ORAL | 0 refills | Status: AC
  Filled 2024-08-04 – 2024-08-05 (×2): qty 30, 30d supply, fill #0

## 2024-08-05 ENCOUNTER — Other Ambulatory Visit: Payer: Self-pay

## 2024-08-06 ENCOUNTER — Other Ambulatory Visit: Payer: Self-pay

## 2024-08-09 ENCOUNTER — Other Ambulatory Visit: Payer: Self-pay

## 2024-09-02 ENCOUNTER — Other Ambulatory Visit: Payer: Self-pay

## 2024-09-02 MED ORDER — GUANFACINE HCL ER 2 MG PO TB24
2.0000 mg | ORAL_TABLET | ORAL | 6 refills | Status: AC
  Filled 2024-09-02: qty 30, 30d supply, fill #0
  Filled 2024-11-02: qty 30, 30d supply, fill #1

## 2024-09-02 MED ORDER — LISDEXAMFETAMINE DIMESYLATE 60 MG PO CAPS
60.0000 mg | ORAL_CAPSULE | Freq: Every day | ORAL | 0 refills | Status: DC
  Filled 2024-09-02: qty 30, 30d supply, fill #0

## 2024-09-02 MED ORDER — ZOLPIDEM TARTRATE 5 MG PO TABS
5.0000 mg | ORAL_TABLET | Freq: Every evening | ORAL | 0 refills | Status: AC
  Filled 2024-09-02: qty 30, 30d supply, fill #0

## 2024-09-06 ENCOUNTER — Other Ambulatory Visit: Payer: Self-pay

## 2024-09-08 ENCOUNTER — Other Ambulatory Visit: Payer: Self-pay

## 2024-09-10 ENCOUNTER — Other Ambulatory Visit: Payer: Self-pay

## 2024-10-04 ENCOUNTER — Other Ambulatory Visit: Payer: Self-pay

## 2024-10-04 MED ORDER — LISDEXAMFETAMINE DIMESYLATE 60 MG PO CAPS
60.0000 mg | ORAL_CAPSULE | Freq: Every day | ORAL | 0 refills | Status: AC
Start: 2024-10-04 — End: ?
  Filled 2024-10-04: qty 30, 30d supply, fill #0

## 2024-10-04 MED ORDER — ZOLPIDEM TARTRATE 5 MG PO TABS
5.0000 mg | ORAL_TABLET | Freq: Every evening | ORAL | 0 refills | Status: AC
  Filled 2024-10-04: qty 30, 30d supply, fill #0

## 2024-10-05 ENCOUNTER — Other Ambulatory Visit: Payer: Self-pay

## 2024-10-05 MED ORDER — BUTALBITAL-APAP-CAFFEINE 50-325-40 MG PO TABS
1.0000 | ORAL_TABLET | ORAL | 1 refills | Status: AC | PRN
  Filled 2024-10-05: qty 90, 15d supply, fill #0
  Filled 2024-11-02: qty 90, 15d supply, fill #1

## 2024-10-06 ENCOUNTER — Other Ambulatory Visit: Payer: Self-pay

## 2024-10-12 ENCOUNTER — Other Ambulatory Visit: Payer: Self-pay

## 2024-10-12 MED ORDER — TRAMADOL HCL 50 MG PO TABS
50.0000 mg | ORAL_TABLET | Freq: Four times a day (QID) | ORAL | 0 refills | Status: AC
  Filled 2024-10-12: qty 8, 2d supply, fill #0

## 2024-10-20 ENCOUNTER — Other Ambulatory Visit: Payer: Self-pay

## 2024-11-03 ENCOUNTER — Other Ambulatory Visit: Payer: Self-pay

## 2024-11-03 MED ORDER — ZOLPIDEM TARTRATE 5 MG PO TABS
5.0000 mg | ORAL_TABLET | Freq: Every evening | ORAL | 0 refills | Status: AC
  Filled 2024-11-03: qty 30, 30d supply, fill #0

## 2024-11-03 MED ORDER — LISDEXAMFETAMINE DIMESYLATE 60 MG PO CAPS
60.0000 mg | ORAL_CAPSULE | Freq: Every day | ORAL | 0 refills | Status: DC
  Filled 2024-11-03: qty 30, 30d supply, fill #0

## 2024-11-30 ENCOUNTER — Other Ambulatory Visit: Payer: Self-pay

## 2024-12-01 ENCOUNTER — Other Ambulatory Visit: Payer: Self-pay

## 2024-12-01 MED ORDER — LISDEXAMFETAMINE DIMESYLATE 60 MG PO CAPS
60.0000 mg | ORAL_CAPSULE | Freq: Every day | ORAL | 0 refills | Status: AC
  Filled 2024-12-02: qty 30, 30d supply, fill #0

## 2024-12-01 MED ORDER — ZOLPIDEM TARTRATE 5 MG PO TABS
5.0000 mg | ORAL_TABLET | Freq: Every evening | ORAL | 0 refills | Status: AC
  Filled 2024-12-02: qty 30, 30d supply, fill #0

## 2024-12-02 ENCOUNTER — Other Ambulatory Visit: Payer: Self-pay

## 2024-12-06 ENCOUNTER — Other Ambulatory Visit: Payer: Self-pay
